# Patient Record
Sex: Female | Born: 1950 | Race: White | Hispanic: No | Marital: Married | Smoking: Never smoker
Health system: Southern US, Community
[De-identification: ages and names within clinical notes are randomized; demographics above are authoritative.]

## PROBLEM LIST (undated history)

## (undated) DIAGNOSIS — E079 Disorder of thyroid, unspecified: Secondary | ICD-10-CM

## (undated) DIAGNOSIS — Z800 Family history of malignant neoplasm of digestive organs: Secondary | ICD-10-CM

## (undated) DIAGNOSIS — M1990 Unspecified osteoarthritis, unspecified site: Secondary | ICD-10-CM

## (undated) DIAGNOSIS — E7800 Pure hypercholesterolemia, unspecified: Secondary | ICD-10-CM

## (undated) DIAGNOSIS — I2510 Atherosclerotic heart disease of native coronary artery without angina pectoris: Secondary | ICD-10-CM

## (undated) DIAGNOSIS — C50919 Malignant neoplasm of unspecified site of unspecified female breast: Secondary | ICD-10-CM

## (undated) DIAGNOSIS — Z801 Family history of malignant neoplasm of trachea, bronchus and lung: Secondary | ICD-10-CM

## (undated) DIAGNOSIS — Z803 Family history of malignant neoplasm of breast: Secondary | ICD-10-CM

## (undated) DIAGNOSIS — E669 Obesity, unspecified: Secondary | ICD-10-CM

## (undated) HISTORY — DX: Malignant neoplasm of unspecified site of unspecified female breast: C50.919

## (undated) HISTORY — DX: Atherosclerotic heart disease of native coronary artery without angina pectoris: I25.10

## (undated) HISTORY — DX: Pure hypercholesterolemia, unspecified: E78.00

## (undated) HISTORY — DX: Unspecified osteoarthritis, unspecified site: M19.90

## (undated) HISTORY — DX: Family history of malignant neoplasm of breast: Z80.3

## (undated) HISTORY — DX: Obesity, unspecified: E66.9

## (undated) HISTORY — DX: Family history of malignant neoplasm of trachea, bronchus and lung: Z80.1

## (undated) HISTORY — DX: Family history of malignant neoplasm of digestive organs: Z80.0

## (undated) HISTORY — PX: CORONARY STENT PLACEMENT: SHX1402

## (undated) HISTORY — DX: Disorder of thyroid, unspecified: E07.9

---

## 2014-01-16 HISTORY — PX: COLONOSCOPY: SHX174

## 2020-02-04 ENCOUNTER — Other Ambulatory Visit: Payer: Self-pay | Admitting: Family

## 2020-02-04 DIAGNOSIS — R921 Mammographic calcification found on diagnostic imaging of breast: Secondary | ICD-10-CM

## 2020-02-13 ENCOUNTER — Ambulatory Visit
Admission: RE | Admit: 2020-02-13 | Discharge: 2020-02-13 | Disposition: A | Discharge status: Home / Self Care | Payer: Medicare Other | Source: Ambulatory Visit | Attending: Family | Admitting: Family

## 2020-02-13 ENCOUNTER — Other Ambulatory Visit: Payer: Self-pay

## 2020-02-13 DIAGNOSIS — R921 Mammographic calcification found on diagnostic imaging of breast: Secondary | ICD-10-CM

## 2020-02-18 ENCOUNTER — Ambulatory Visit: Payer: Self-pay | Admitting: Surgery

## 2020-02-24 ENCOUNTER — Other Ambulatory Visit: Payer: Self-pay | Admitting: *Deleted

## 2020-02-24 NOTE — Progress Notes (Signed)
error 

## 2020-02-25 ENCOUNTER — Telehealth: Payer: Self-pay | Admitting: Hematology and Oncology

## 2020-02-25 NOTE — Telephone Encounter (Signed)
Received a new patient referral from Dr. Luisa Hart from [facility] for breast cancer. Pt has been cld and scheduled to see Dr. Pamelia Hoit on 6/1 at 3:45pm. Pt aware to arrive 15 minutes early.

## 2020-03-01 NOTE — Progress Notes (Signed)
[facility] CONSULT NOTE  Patient Care Team: Imagene Riches, NP as PCP - General  CHIEF COMPLAINTS/PURPOSE OF CONSULTATION:  Newly diagnosed breast cancer  HISTORY OF PRESENTING ILLNESS:  Norma Graves 69 y.o. female is here because of recent diagnosis of left breast DCIS. Mammogram showed left breast calcifications. Biopsy on 02/13/20 showed intermediate grade DCIS, ER/PR+ 100%. She presents to the clinic today for initial evaluation and discussion of treatment options.  I surely think some of the symptoms not resolve.  I reviewed her records extensively and collaborated the history with the patient.  SUMMARY OF ONCOLOGIC HISTORY: Oncology History  Ductal carcinoma in situ (DCIS) of left breast  02/13/2020 Initial Diagnosis   Screening mammogram detected less than 1 cm cluster of calcifications UIQ left breast.  Core biopsy showed intermediate grade DCIS ER 100%, PR 100%, Tis NX stage 0   03/02/2020 Cancer Staging   Staging form: Breast, AJCC 8th Edition - Clinical stage from 03/02/2020: Stage 0 (cTis (DCIS), cN0, cM0, ER+, PR+, HER2: Not Assessed) - Signed by Nicholas Lose, MD on 03/02/2020     MEDICAL HISTORY:  Coronary artery disease status post stent placement History of congestive heart failure Hypercholesterolemia Hypothyroidism  SURGICAL HISTORY: Shoulder surgery  SOCIAL HISTORY:Occasional alcohol use, denies any drug use.  Never smoker.  FAMILY HISTORY: Breast cancer in sister below age 42 and also an aunt  ALLERGIES:  has no allergies on file.  MEDICATIONS: Metoprolol 25 mg daily, aspirin 81 mg daily, Zetia 10 mg daily, Effient 10 mg daily, fluoxetine 10 mg daily, levothyroxine 50 mcg daily, multivitamin, probiotics  REVIEW OF SYSTEMS:   Constitutional: Denies fevers, chills or abnormal night sweats Eyes: Denies blurriness of vision, double vision or watery eyes Ears, nose, mouth, throat, and face: Denies mucositis or sore throat Respiratory: Denies  cough, dyspnea or wheezes Cardiovascular: Denies palpitation, chest discomfort or lower extremity swelling Gastrointestinal:  Denies nausea, heartburn or change in bowel habits Skin: Denies abnormal skin rashes Lymphatics: Denies new lymphadenopathy or easy bruising Neurological:Denies numbness, tingling or new weaknesses Behavioral/Psych: Mood is stable, no new changes  Breast: Denies any palpable lumps or discharge All other systems were reviewed with the patient and are negative.  PHYSICAL EXAMINATION: ECOG PERFORMANCE STATUS: 1 - Symptomatic but completely ambulatory  Vitals:   03/02/20 1504  BP: 112/68  Pulse: 66  Resp: 18  Temp: 98.3 F (36.8 C)  SpO2: 99%   Filed Weights   03/02/20 1504  Weight: 276 lb 9.6 oz (125.5 kg)    GENERAL:alert, no distress and comfortable SKIN: skin color, texture, turgor are normal, no rashes or significant lesions EYES: normal, conjunctiva are pink and non-injected, sclera clear OROPHARYNX:no exudate, no erythema and lips, buccal mucosa, and tongue normal  NECK: supple, thyroid normal size, non-tender, without nodularity LYMPH:  no palpable lymphadenopathy in the cervical, axillary or inguinal LUNGS: clear to auscultation and percussion with normal breathing effort HEART: regular rate & rhythm and no murmurs and no lower extremity edema ABDOMEN:abdomen soft, non-tender and normal bowel sounds Musculoskeletal:no cyanosis of digits and no clubbing  PSYCH: alert & oriented x 3 with fluent speech NEURO: no focal motor/sensory deficits  RADIOGRAPHIC STUDIES: I have personally reviewed the radiological reports and agreed with the findings in the report.  ASSESSMENT AND PLAN:  Ductal carcinoma in situ (DCIS) of left breast Screening mammogram detected less than 1 cm cluster of calcifications UIQ left breast.  Core biopsy showed intermediate grade DCIS ER 100%, PR 100%, Tis  NX stage 0  Pathology review: I discussed with the patient the  difference between DCIS and invasive breast cancer. It is considered a precancerous lesion. DCIS is classified as a 0. It is generally detected through mammograms as calcifications. We discussed the significance of grades and its impact on prognosis. We also discussed the importance of ER and PR receptors and their implications to adjuvant treatment options. Prognosis of DCIS dependence on grade, comedo necrosis. It is anticipated that if not treated, 20-30% of DCIS can develop into invasive breast cancer.  Recommendation: 1. Breast conserving surgery 2. Followed by adjuvant radiation therapy 3. Followed by antiestrogen therapy with tamoxifen 5 years  Tamoxifen counseling: We discussed the risks and benefits of tamoxifen. These include but not limited to insomnia, hot flashes, mood changes, vaginal dryness, and weight gain. Although rare, serious side effects including endometrial cancer, risk of blood clots were also discussed. We strongly believe that the benefits far outweigh the risks. Patient understands these risks and consented to starting treatment. Planned treatment duration is 5 years.  We also discussed participation in Comet clinical trial.  The trial randomized patients to surgery versus observation. AFT 25 COMET Phase 3 clinical trial for low risk DCIS grade 1/2 PR positive, age greater than 40 randomized to surgery +/- radiation, +/- endocrine therapy versus active surveillance with +/- endocrine therapy surveillance with mammograms every 6 months for 5 years;patient's have option to decline elevated arm and still be followed on study   The major issue with the DCIS treatment plan is related to her recent cardiac stent placement.  She is unable to undergo any surgical intervention for at least 6 months.  We will have to assess if on the clinical trial she is allowed to take a 36-monthhiatus if she was to be randomized to surgery arm.  We provided her with information today and we will  make a final decision on feasibility and inform her. Will consult genetics.  All questions were answered. The patient knows to call the clinic with any problems, questions or concerns.   VRulon Eisenmenger MD, MPH 03/02/2020    I, Molly Dorshimer, am acting as scribe for VNicholas Lose MD.  I have reviewed the above documentation for accuracy and completeness, and I agree with the above.

## 2020-03-02 ENCOUNTER — Inpatient Hospital Stay: Payer: Medicare Other | Attending: Hematology and Oncology | Admitting: Hematology and Oncology

## 2020-03-02 ENCOUNTER — Encounter: Payer: Self-pay | Admitting: *Deleted

## 2020-03-02 ENCOUNTER — Other Ambulatory Visit: Payer: Self-pay

## 2020-03-02 DIAGNOSIS — Z955 Presence of coronary angioplasty implant and graft: Secondary | ICD-10-CM | POA: Diagnosis not present

## 2020-03-02 DIAGNOSIS — E7800 Pure hypercholesterolemia, unspecified: Secondary | ICD-10-CM | POA: Insufficient documentation

## 2020-03-02 DIAGNOSIS — Z803 Family history of malignant neoplasm of breast: Secondary | ICD-10-CM | POA: Insufficient documentation

## 2020-03-02 DIAGNOSIS — D0512 Intraductal carcinoma in situ of left breast: Secondary | ICD-10-CM | POA: Diagnosis present

## 2020-03-02 DIAGNOSIS — I2510 Atherosclerotic heart disease of native coronary artery without angina pectoris: Secondary | ICD-10-CM | POA: Diagnosis not present

## 2020-03-02 DIAGNOSIS — E039 Hypothyroidism, unspecified: Secondary | ICD-10-CM | POA: Insufficient documentation

## 2020-03-02 DIAGNOSIS — I509 Heart failure, unspecified: Secondary | ICD-10-CM | POA: Diagnosis not present

## 2020-03-02 DIAGNOSIS — Z79899 Other long term (current) drug therapy: Secondary | ICD-10-CM | POA: Insufficient documentation

## 2020-03-02 NOTE — Research (Signed)
03/02/2020 at 4:55pm - COMET/AFT-25 study notes- Research nurse and Clinical Research Coordinator 1, Carol Ada, met with the pt for 20 minutes about the study.  The pt stated she was interested in learning more about the study.  The pt was given the consent form, the study brochure, the [facility] brochure, and the study nurse's business card.  The pt was encouraged to read the consent form and call the nurse if she has any questions or concerns about the study.  The research nurse agreed to start reviewing the pt's medical record for eligibility purposes.  The pt informed the nurse that she cannot have surgery for 6 months due to her current use of a blood thinner.  The research nurse will review the protocol to see if the pt will qualify for study entry.  The pt and research nurse agreed to talk on Friday, 6/4 or Monday, 6/7 about her participation.  The pt was thanked for her interest in the COMET study. Brion Aliment RN, BSN, CCRP  Clinical Research Nurse 03/02/2020 5:00 PM   03/10/20-12:58pm - The research nurse called the pt on 03/05/20 about the study.  The pt said that she was still interested in participation, but she is not able to have surgery until October 2021.  The research nurse informed the pt that she met all of the criteria for study entry except the "no contraindication for surgery".  The pt was also told that she could possible wait until September to enroll, but she would not be able to start any hormonal therapy until she was enrolled.  The research nurse followed up with the study coordinator, Yolande Jolly, about delaying her enrollment until closer to September.  She suggested that I contact Dr. Donnal Debar about this subject.  Dr. Lindi Adie was informed about the pt's delayed enrollment, and he agreed to hold her hormonal therapy until she was enrolled into the study.  The research nurse sent an email to Dr. Donnal Debar on 03/08/20.  He felt that it was acceptable to delay her enrollment until September as  "there is a 60 day window if she receives surgery".  Dr. Donnal Debar wanted to discuss with Dr. Glade Stanford, study PI, about this pt.  The research nurse left the pt a voicemail informing her that the research nurse was waiting to hear back from study.  The research nurse asked Dr. Donnal Debar if Dr. Glade Stanford had made a decision about if delaying her enrollment and registering her in September when the pt still had a contraindication for surgery until October.  Will await response from Dr. Glade Stanford. Brion Aliment RN, BSN, [place] Clinical Research Nurse 03/10/2020 1:09 PM   03/23/2020 at 4:32pm - Rn received email from Dr. Justice Rocher stating that Dr. Glade Stanford was "OK for this pt to be registered/randomized on 06/03/20 as there is a 60 day window if she receives the surgery arm".  Dr. Glade Stanford was "OK with the delay in the AI if the treating physician was okay" with holding her hormonal medication until after the pt is registered/randomized in September.  Dr. Lindi Adie stated that he is in agreement to enroll this pt with this plan.  The research nurse called the pt and informed her about the plan.  The pt was told that the research nurse will call her in August to set up the consent visit and to schedule a visit with Dr. Lindi Adie to obtain a H/P within 30 days of registration.  All of the pt's questions were answered.  The pt said that she was fine with this plan.  She said that she doesn't like to "rush into anything", and this plan allows her time to carefully weigh her options.  The pt was thanked for her continued interest in the study.  The pt knows to contact the research nurse if she has any further questions or if anything changes in her current health status.  The pt verbalized understanding.   Brion Aliment RN, BSN, CCRP Clinical Research Nurse 03/23/2020 4:40 PM

## 2020-03-02 NOTE — Assessment & Plan Note (Addendum)
Screening mammogram detected less than 1 cm cluster of calcifications UIQ left breast.  Core biopsy showed intermediate grade DCIS ER 100%, PR 100%, Tis NX stage 0  Pathology review: I discussed with the patient the difference between DCIS and invasive breast cancer. It is considered a precancerous lesion. DCIS is classified as a 0. It is generally detected through mammograms as calcifications. We discussed the significance of grades and its impact on prognosis. We also discussed the importance of ER and PR receptors and their implications to adjuvant treatment options. Prognosis of DCIS dependence on grade, comedo necrosis. It is anticipated that if not treated, 20-30% of DCIS can develop into invasive breast cancer.  Recommendation: 1. Breast conserving surgery 2. Followed by adjuvant radiation therapy 3. Followed by antiestrogen therapy with tamoxifen 5 years  Tamoxifen counseling: We discussed the risks and benefits of tamoxifen. These include but not limited to insomnia, hot flashes, mood changes, vaginal dryness, and weight gain. Although rare, serious side effects including endometrial cancer, risk of blood clots were also discussed. We strongly believe that the benefits far outweigh the risks. Patient understands these risks and consented to starting treatment. Planned treatment duration is 5 years.  We also discussed participation in Comet clinical trial.  The trial randomized patients to surgery versus observation. AFT 25 COMET Phase 3 clinical trial for low risk DCIS grade 1/2 PR positive, age greater than 40 randomized to surgery +/- radiation, +/- endocrine therapy versus active surveillance with +/- endocrine therapy surveillance with mammograms every 6 months for 5 years;patient's have option to decline elevated arm and still be followed on study

## 2020-03-03 ENCOUNTER — Encounter: Payer: Self-pay | Admitting: *Deleted

## 2020-03-04 ENCOUNTER — Encounter: Payer: Self-pay | Admitting: Licensed Clinical Social Worker

## 2020-03-04 NOTE — Progress Notes (Signed)
[facility] Clinical Social Work  Clinical Social Work reached out to new patient for assessment of psychosocial needs.  Clinical Social Worker contacted patient by phone  to offer support and assess for needs.    Patient reports that she is doing well so far, just waiting to hear back regarding the COMET clinical trial. She feels she received good information during her visit with Dr. Pamelia Hoit and that helped reassure her. She receives good support from her husband and has no concerns regarding transportation or resources at this time.  CSW gave direct contact information should patient need support in the future.    Norma Graves E, LCSW  Clinical Social Worker [facility]

## 2020-03-05 ENCOUNTER — Telehealth: Payer: Self-pay | Admitting: Genetic Counselor

## 2020-03-05 NOTE — Telephone Encounter (Signed)
Scheduled per los, patient has been called and notified. 

## 2020-03-12 ENCOUNTER — Encounter: Payer: Self-pay | Admitting: *Deleted

## 2020-03-18 ENCOUNTER — Encounter: Payer: Self-pay | Admitting: Genetic Counselor

## 2020-03-18 ENCOUNTER — Other Ambulatory Visit: Payer: Self-pay | Admitting: Genetic Counselor

## 2020-03-18 ENCOUNTER — Inpatient Hospital Stay (HOSPITAL_BASED_OUTPATIENT_CLINIC_OR_DEPARTMENT_OTHER): Payer: Medicare Other | Admitting: Genetic Counselor

## 2020-03-18 ENCOUNTER — Other Ambulatory Visit: Payer: Self-pay

## 2020-03-18 ENCOUNTER — Inpatient Hospital Stay: Payer: Medicare Other

## 2020-03-18 DIAGNOSIS — Z801 Family history of malignant neoplasm of trachea, bronchus and lung: Secondary | ICD-10-CM | POA: Insufficient documentation

## 2020-03-18 DIAGNOSIS — Z803 Family history of malignant neoplasm of breast: Secondary | ICD-10-CM | POA: Insufficient documentation

## 2020-03-18 DIAGNOSIS — D0512 Intraductal carcinoma in situ of left breast: Secondary | ICD-10-CM

## 2020-03-18 DIAGNOSIS — Z1379 Encounter for other screening for genetic and chromosomal anomalies: Secondary | ICD-10-CM

## 2020-03-18 DIAGNOSIS — Z800 Family history of malignant neoplasm of digestive organs: Secondary | ICD-10-CM | POA: Insufficient documentation

## 2020-03-18 NOTE — Progress Notes (Signed)
REFERRING PROVIDER: Nicholas Lose, MD [address] [place],  [place] [address]  PRIMARY PROVIDER:  Imagene Riches, NP  PRIMARY REASON FOR VISIT:  1. Ductal carcinoma in situ (DCIS) of left breast   2. Family history of breast cancer   3. Family history of rectal cancer   4. Family history of lung cancer       HISTORY OF PRESENT ILLNESS:   [name], a 69 y.o. female, was seen for a [place] cancer genetics consultation at the request of Dr. Lindi Adie due to a personal and family history of cancer.  Norma Graves presents to clinic today with her daughter to discuss the possibility of a hereditary predisposition to cancer, genetic testing, and to further clarify her future cancer risks, as well as potential cancer risks for family members.   In May 2021, at the age of 44, Norma Graves was diagnosed with ductal carcinoma in situ, ER+/PR+, of the left breast. The treatment plan includes consideration of the COMET clinical trial, surgery, adjuvant radiation therapy, and antiestrogen therapy.    CANCER HISTORY:  Oncology History  Ductal carcinoma in situ (DCIS) of left breast  02/13/2020 Initial Diagnosis   Screening mammogram detected less than 1 cm cluster of calcifications UIQ left breast.  Core biopsy showed intermediate grade DCIS ER 100%, PR 100%, Tis NX stage 0   03/02/2020 Cancer Staging   Staging form: Breast, AJCC 8th Edition - Clinical stage from 03/02/2020: Stage 0 (cTis (DCIS), cN0, cM0, ER+, PR+, HER2: Not Assessed) - Signed by Nicholas Lose, MD on 03/02/2020     RISK FACTORS:  Menarche was at age 47-14.  First live birth at age 53.  OCP use for approximately 2 years.  Ovaries intact: yes.  Hysterectomy: no.  Menopausal status: postmenopausal.  HRT use: 0 years. Colonoscopy: yes; four years ago; around 2 polyps, per patient. Mammogram within the last year: yes. Number of breast biopsies: 2. Any excessive radiation exposure in the past: no   Past  Medical History:  Diagnosis Date  . Family history of breast cancer   . Family history of lung cancer   . Family history of rectal cancer      Social History   Socioeconomic History  . Marital status: Married    Spouse name: Not on file  . Number of children: Not on file  . Years of education: Not on file  . Highest education level: Not on file  Occupational History  . Not on file  Tobacco Use  . Smoking status: Not on file  Substance and Sexual Activity  . Alcohol use: Not on file  . Drug use: Not on file  . Sexual activity: Not on file  Other Topics Concern  . Not on file  Social History Narrative  . Not on file   Social Determinants of Health   Financial Resource Strain:   . Difficulty of Paying Living Expenses:   Food Insecurity:   . Worried About Charity fundraiser in the Last Year:   . Arboriculturist in the Last Year:   Transportation Needs:   . Film/video editor (Medical):   Marland Kitchen Lack of Transportation (Non-Medical):   Physical Activity:   . Days of Exercise per Week:   . Minutes of Exercise per Session:   Stress:   . Feeling of Stress :   Social Connections:   . Frequency of Communication with Friends and Family:   . Frequency of Social Gatherings with  Friends and Family:   . Attends Religious Services:   . Active Member of Clubs or Organizations:   . Attends Archivist Meetings:   Marland Kitchen Marital Status:      FAMILY HISTORY:  We obtained a detailed, 4-generation family history.  Significant diagnoses are listed below: Family History  Problem Relation Age of Onset  . Rectal cancer Mother        dx. in her late 88s  . Lung cancer Father 51       smoker  . Breast cancer Sister        dx. in her 9s  . Breast cancer Maternal Aunt 3   Norma Graves has two daughters (ages 28 and 55). She has two sisters and two brothers. One sister died in her late 33s and had a history of breast cancer diagnosed in her 22s.   Norma Graves mother died  in her late 16s and had a history of rectal cancer diagnosed in her late 59s. Norma Graves had three maternal aunts and three maternal uncles. One aunt had a history of breast cancer diagnosed around the age of 48. There is no cancer among maternal cousins. Her maternal grandmother died in her 57s and her maternal grandfather died in his 79s. There are no other known diagnoses of cancer on the maternal side of the family.  Norma Graves's father died at the age of 29 from lung cancer and had a history of smoking. She had three paternal aunts and four paternal uncles. She does not know of any cancer among these relatives, although she has limited information about them. Her paternal grandmother died in her 22s with Alzheimer's, and her paternal grandfather died in his 48s or 33s. She is unsure if her grandfather may have had cancer. There are no other known cancer diagnoses on the paternal side of the family.  Norma Graves is unaware of previous family history of genetic testing for hereditary cancer risks. Patient's maternal ancestors are of unknown descent, and paternal ancestors are of English descent. There is no reported Ashkenazi Jewish ancestry. There is no known consanguinity.  GENETIC COUNSELING ASSESSMENT: Norma Graves is a 69 y.o. female with a personal and family history of breast cancer, which is somewhat suggestive of a hereditary cancer syndrome and predisposition to cancer. We, therefore, discussed and recommended the following at today's visit.   DISCUSSION: We discussed that 5-10% of breast cancer is hereditary, with most cases associated with the BRCA1 and BRCA2 genes.  There are other genes that can be associated with hereditary breast cancer syndromes.  These include ATM, CHEK2, PALB2, etc.  We discussed that testing is beneficial for several reasons, including knowing about other cancer risks, identifying potential screening and risk-reduction options that may be appropriate, and to  understand if other family members could be at risk for cancer and allow them to undergo genetic testing.   We reviewed the characteristics, features and inheritance patterns of hereditary cancer syndromes. We also discussed genetic testing, including the appropriate family members to test, the process of testing, insurance coverage and turn-around-time for results. We discussed the implications of a negative, positive and/or variant of uncertain significant result. We recommended Ms. Hazell pursue genetic testing for the [facility] Common Hereditary Cancers panel.   The Common Hereditary Cancers Panel offered by [facility] includes sequencing and/or deletion duplication testing of the following 48 genes: APC, ATM, AXIN2, BARD1, BMPR1A, BRCA1, BRCA2, BRIP1, CDH1, CDK4, CDKN2A (p14ARF), CDKN2A (p16INK4a), CHEK2, CTNNA1, DICER1, EPCAM (Deletion/duplication  testing only), GREM1 (promoter region deletion/duplication testing only), KIT, MEN1, MLH1, MSH2, MSH3, MSH6, MUTYH, NBN, NF1, NHTL1, PALB2, PDGFRA, PMS2, POLD1, POLE, PTEN, RAD50, RAD51C, RAD51D, RNF43, SDHB, SDHC, SDHD, SMAD4, SMARCA4. STK11, TP53, TSC1, TSC2, and VHL.  The following genes were evaluated for sequence changes only: SDHA and HOXB13 c.251G>A variant only.   Based on Ms. Heidelberger's personal and family history of cancer, she meets medical criteria for genetic testing. Despite that she meets criteria, she may still have an out of pocket cost. We discussed that if her out of pocket cost for testing is over $100, the laboratory will reach out to let her know.  If the out of pocket cost of testing is less than $100 she will be billed by the genetic testing laboratory.   PLAN: After considering the risks, benefits, and limitations, Ms. Iwasaki provided informed consent to pursue genetic testing and the blood sample was sent to [facility] for analysis of the Common Hereditary Cancers Panel. Results should be available within approximately  two-three weeks' time, at which point they will be disclosed by telephone to Ms. Venturini, as will any additional recommendations warranted by these results. Ms. Shidler will receive a summary of her genetic counseling visit and a copy of her results once available. This information will also be available in Epic.   Ms. Mandarino questions were answered to her satisfaction today. Our contact information was provided should additional questions or concerns arise. Thank you for the referral and allowing [place] to share in the care of your patient.   Clint Guy, [place], [facility] Licensed, Certified Dispensing [email or web address] Phone: [phone]  The patient was seen for a total of 50 minutes in face-to-face genetic counseling.  This patient was discussed with Drs. Magrinat, Lindi Adie and/or Burr Medico who agrees with the above.    _______________________________________________________________________ For Office Staff:  Number of people involved in session: 1 Was an Intern/ student involved with case: Yes - two genetic counseling interns observed only

## 2020-03-25 ENCOUNTER — Encounter: Payer: Self-pay | Admitting: *Deleted

## 2020-03-25 LAB — GENETIC SCREENING ORDER

## 2020-03-26 ENCOUNTER — Telehealth: Payer: Self-pay | Admitting: Genetic Counselor

## 2020-03-26 ENCOUNTER — Encounter: Payer: Self-pay | Admitting: Genetic Counselor

## 2020-03-26 ENCOUNTER — Ambulatory Visit: Payer: Self-pay | Admitting: Genetic Counselor

## 2020-03-26 DIAGNOSIS — Z1379 Encounter for other screening for genetic and chromosomal anomalies: Secondary | ICD-10-CM | POA: Insufficient documentation

## 2020-03-26 NOTE — Progress Notes (Signed)
HPI:  Ms. Hofstra was previously seen in the [place] clinic due to a personal and family history of cancer and concerns regarding a hereditary predisposition to cancer. Please refer to our prior cancer genetics clinic note for more information regarding our discussion, assessment and recommendations, at the time. Ms. Wahlberg recent genetic test results were disclosed to her, as were recommendations warranted by these results. These results and recommendations are discussed in more detail below.  CANCER HISTORY:  Oncology History  Ductal carcinoma in situ (DCIS) of left breast  02/13/2020 Initial Diagnosis   Screening mammogram detected less than 1 cm cluster of calcifications UIQ left breast.  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 The report date is 03/25/2020.  The Common Hereditary Cancers Panel offered by [facility] includes sequencing and/or deletion duplication testing of the following 48 genes: APC, ATM, AXIN2, BARD1, BMPR1A, BRCA1, BRCA2, BRIP1, CDH1, CDK4, CDKN2A (p14ARF), CDKN2A (p16INK4a), CHEK2, CTNNA1, DICER1, EPCAM (Deletion/duplication testing only), GREM1 (promoter region deletion/duplication testing only), KIT, MEN1, MLH1, MSH2, MSH3, MSH6, MUTYH, NBN, NF1, NHTL1, PALB2, PDGFRA, PMS2, POLD1, POLE, PTEN, RAD50, RAD51C, RAD51D, RNF43, SDHB, SDHC, SDHD, SMAD4, SMARCA4. STK11, TP53, TSC1, TSC2, and VHL.  The following genes were evaluated for sequence changes only: SDHA and HOXB13 c.251G>A variant only.     FAMILY HISTORY:  We obtained a detailed, 4-generation family history.  Significant  diagnoses are listed below: Family History  Problem Relation Age of Onset  . Rectal cancer Mother        dx. in her late 35s  . Lung cancer Father 6       smoker  . Breast cancer Sister        dx. in her 39s  . Breast cancer Maternal Aunt 78   Ms. Longwell has two daughters (ages 36 and 59). She has two sisters and two brothers. One sister died in her late 33s and had a history of breast cancer diagnosed in her 33s.   Ms. Demeter mother died in her late 9s and had a history of rectal cancer diagnosed in her late 13s. Ms. Creer had three maternal aunts and three maternal uncles. One aunt had a history of breast cancer diagnosed around the age of 8. There is no cancer among maternal cousins. Her maternal grandmother died in her 61s and her maternal grandfather died in his 61s. There are no other known diagnoses of cancer on the maternal side of the family.  Ms. Forni's father died at the age of 42 from lung cancer and had a history of smoking. She had three paternal aunts and four paternal uncles. She does not know of any cancer among these relatives, although she has limited information about them. Her paternal grandmother died in her 96s with Alzheimer's, and her paternal grandfather died in his 55s or 42s. She is unsure if her grandfather may have had cancer. There are no other known cancer diagnoses on the paternal side of the family.  Ms. Lordi is unaware of previous family history of genetic testing for hereditary cancer risks. Patient's maternal ancestors are of unknown descent, and paternal  ancestors are of English descent. There is no reported Ashkenazi Jewish ancestry. There is no known consanguinity.  GENETIC TEST RESULTS: Genetic testing reported out on 03/25/2020 through the [facility] Common Hereditary Cancers panel. No pathogenic variants were detected.   The Common Hereditary Cancers Panel offered by [facility] includes sequencing and/or deletion duplication testing of  the following 48 genes: APC, ATM, AXIN2, BARD1, BMPR1A, BRCA1, BRCA2, BRIP1, CDH1, CDK4, CDKN2A (p14ARF), CDKN2A (p16INK4a), CHEK2, CTNNA1, DICER1, EPCAM (Deletion/duplication testing only), GREM1 (promoter region deletion/duplication testing only), KIT, MEN1, MLH1, MSH2, MSH3, MSH6, MUTYH, NBN, NF1, NHTL1, PALB2, PDGFRA, PMS2, POLD1, POLE, PTEN, RAD50, RAD51C, RAD51D, RNF43, SDHB, SDHC, SDHD, SMAD4, SMARCA4. STK11, TP53, TSC1, TSC2, and VHL.  The following genes were evaluated for sequence changes only: SDHA and HOXB13 c.251G>A variant only. The test report will be scanned into EPIC and located under the Molecular Pathology section of the Results Review tab.  A portion of the result report is included below for reference.     We discussed with Ms. Hebel that because current genetic testing is not perfect, it is possible there may be a gene mutation in one of these genes that current testing cannot detect, but that chance is small.  We also discussed, that there could be another gene that has not yet been discovered, or that we have not yet tested, that is responsible for the cancer diagnoses in the family. It is also possible there is a hereditary cause for the cancer in the family that Ms. Huot did not inherit and therefore was not identified in her testing.  Therefore, it is important to remain in touch with cancer genetics in the future so that we can continue to offer Ms. Lenig the most up to date genetic testing.   CANCER SCREENING RECOMMENDATIONS: Ms. Flippen test result is considered negative (normal).  This means that we have not identified a hereditary cause for her personal and family history of cancer at this time. While reassuring, this does not definitively rule out a hereditary predisposition to cancer. It is still possible that there could be genetic mutations that are undetectable by current technology. There could be genetic mutations in genes that have not been tested or  identified to increase cancer risk.  Therefore, it is recommended she continue to follow the cancer management and screening guidelines provided by her oncology and primary healthcare providers.   An individual's cancer risk and medical management are not determined by genetic test results alone. Overall cancer risk assessment incorporates additional factors, including personal medical history, family history, and any available genetic information that may result in a personalized plan for cancer prevention and surveillance.  RECOMMENDATIONS FOR FAMILY MEMBERS:  Individuals in this family might be at some increased risk of developing cancer, over the general population risk, simply due to the family history of cancer.  We recommended women in this family have a yearly mammogram beginning at age 9, or 38 years younger than the earliest onset of cancer, an annual clinical breast exam, and perform monthly breast self-exams. Women in this family should also have a gynecological exam as recommended by their primary provider. All family members should be referred for colonoscopy starting at age 32.  It is also possible there is a hereditary cause for the cancer in Ms. Springborn's family that she did not inherit and therefore was not identified in her.  Based on Ms. Mcandrew's family history, we recommended her siblings have genetic counseling and testing. Ms. Bleau will let us know  if we can be of any assistance in coordinating genetic counseling and/or testing for these family members.   FOLLOW-UP: Lastly, we discussed with Ms. Girardot that cancer genetics is a rapidly advancing field and it is possible that new genetic tests will be appropriate for her and/or her family members in the future. We encouraged her to remain in contact with cancer genetics on an annual basis so we can update her personal and family histories and let her know of advances in cancer genetics that may benefit this family.   Our  contact number was provided. Ms. Salas questions were answered to her satisfaction, and she knows she is welcome to call us at anytime with additional questions or concerns.   Clint Guy, MS, [facility] Genetic Counselor [email or web address] Phone: [phone]

## 2020-03-26 NOTE — Telephone Encounter (Signed)
Revealed negative genetic testing. Discussed that we do not know why she has breast cancer or why there is cancer in the family. There could be a genetic mutation in the family that Norma Graves did not inherit. There could also be a mutation in a different gene that we are not testing, or our current technology may not be able detect certain mutations. It will therefore be important for her to stay in contact with genetics to keep up with whether additional testing may be appropriate in the future.

## 2020-05-07 ENCOUNTER — Telehealth: Payer: Self-pay | Admitting: *Deleted

## 2020-05-07 NOTE — Telephone Encounter (Signed)
05/07/20 at 10:32am- COMET study- The research nurse called the pt to see if she was still interested in participating in the COMET study.  The pt said that she still interested in the study.  The pt agreed to come to the [facility] in late August for her updated H/P with Dr. Pamelia Hoit.  The pt also agreed to meet with the research nurse to sign the consent form and complete her baseline questionnaire for the study.  The pt was encouraged to re-read her consent form, and ask Dr. Pamelia Hoit or the study nurse any questions/concerns she has about the study.  The pt must be enrolled into the study by 06/03/20.  The pt was thanked for her ongoing interest in the COMET study.  Janan Ridge RN, BSN, CCRP Clinical Research Nurse 05/07/2020 10:37 AM

## 2020-05-19 ENCOUNTER — Encounter: Payer: Self-pay | Admitting: *Deleted

## 2020-05-30 NOTE — Progress Notes (Signed)
Patient Care Team: Imagene Riches, NP as PCP - Philomena Doheny, Paulette Blanch, RN as Oncology Nurse Navigator Rockwell Germany, RN as Oncology Nurse Navigator Nicholas Lose, MD as Medical Oncologist (Hematology and Oncology)  DIAGNOSIS:    ICD-10-CM   1. Ductal carcinoma in situ (DCIS) of left breast  D05.12     SUMMARY OF ONCOLOGIC HISTORY: Oncology History  Ductal carcinoma in situ (DCIS) of left breast  02/13/2020 Initial Diagnosis   Screening mammogram detected less than 1 cm cluster of calcifications UIQ left breast.  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 The report date is 03/25/2020.  The Common Hereditary Cancers Panel offered by [facility] includes sequencing and/or deletion duplication testing of the following 48 genes: APC, ATM, AXIN2, BARD1, BMPR1A, BRCA1, BRCA2, BRIP1, CDH1, CDK4, CDKN2A (p14ARF), CDKN2A (p16INK4a), CHEK2, CTNNA1, DICER1, EPCAM (Deletion/duplication testing only), GREM1 (promoter region deletion/duplication testing only), KIT, MEN1, MLH1, MSH2, MSH3, MSH6, MUTYH, NBN, NF1, NHTL1, PALB2, PDGFRA, PMS2, POLD1, POLE, PTEN, RAD50, RAD51C, RAD51D, RNF43, SDHB, SDHC, SDHD, SMAD4, SMARCA4. STK11, TP53, TSC1, TSC2, and VHL.  The following genes were evaluated for sequence changes only: SDHA and HOXB13 c.251G>A variant only.     CHIEF COMPLIANT: Follow-up of left breast DCIS to discuss COMET clinical trial  INTERVAL HISTORY: Norma Graves is a 69 y.o. with above-mentioned history of left breast DCIS. She presents to the clinic today to discuss participation in the COMET clinical trial.  She has  occasional left breast discomfort and her left breast feels bigger than the right.  She has had problems with her knees which cause pain.  She is getting physical therapy for that.  ALLERGIES:  has no allergies on file.  MEDICATIONS:  No current outpatient medications on file.   No current facility-administered medications for this visit.    PHYSICAL EXAMINATION: HEENT: No palpable lumps or nodules Lungs: Clear Heart: S1 is normal there is an ejection systolic murmur Abdomen: No palpable liver or spleen enlargement Extremities no edema, the leg itself appears to be enlarged Neurological exam: Grossly intact  ECOG PERFORMANCE STATUS: 1 - Symptomatic but completely ambulatory  Vitals:   05/31/20 0841  BP: 124/74  Pulse: 63  Resp: 17  Temp: (!) 97.1 F (36.2 C)  SpO2: 100%   Filed Weights   05/31/20 0841  Weight: 272 lb 12.8 oz (123.7 kg)     ASSESSMENT & PLAN:  Ductal carcinoma in situ (DCIS) of left breast Screening mammogram detected less than 1 cm cluster of calcifications UIQ left breast.  Core biopsy showed intermediate grade DCIS ER 100%, PR 100%, Tis NX stage 0 Genetic testing: Negative Current treatment: COMET study participation Patient is eligible for the trial. All of her questions have been answered.  Cardiac stent placement: Patient has 2 months to undergo surgery if she is randomized to the surgery arm.  Return to clinic based on her randomization    No orders of the defined types were placed in this encounter.  The patient has a good understanding of the overall plan. she agrees with it. she will call with any problems that may develop  before the next visit here.  Total time spent: 30 mins including face to face time and time spent for planning, charting and coordination of care  Nicholas Lose, MD 05/31/2020  I, Cloyde Reams Dorshimer, am acting as scribe for Dr. Nicholas Lose.  I have reviewed the above documentation for accuracy and completeness, and I  agree with the above.

## 2020-05-31 ENCOUNTER — Encounter: Payer: Self-pay | Admitting: *Deleted

## 2020-05-31 ENCOUNTER — Inpatient Hospital Stay: Payer: Medicare Other | Attending: Hematology and Oncology | Admitting: Hematology and Oncology

## 2020-05-31 ENCOUNTER — Other Ambulatory Visit: Payer: Self-pay

## 2020-05-31 DIAGNOSIS — Z79811 Long term (current) use of aromatase inhibitors: Secondary | ICD-10-CM | POA: Diagnosis not present

## 2020-05-31 DIAGNOSIS — D0512 Intraductal carcinoma in situ of left breast: Secondary | ICD-10-CM | POA: Diagnosis present

## 2020-05-31 DIAGNOSIS — R011 Cardiac murmur, unspecified: Secondary | ICD-10-CM | POA: Insufficient documentation

## 2020-05-31 DIAGNOSIS — Z170 Estrogen receptor positive status [ER+]: Secondary | ICD-10-CM | POA: Diagnosis not present

## 2020-05-31 DIAGNOSIS — Z955 Presence of coronary angioplasty implant and graft: Secondary | ICD-10-CM | POA: Insufficient documentation

## 2020-05-31 DIAGNOSIS — Z79899 Other long term (current) drug therapy: Secondary | ICD-10-CM | POA: Insufficient documentation

## 2020-05-31 NOTE — Assessment & Plan Note (Signed)
Screening mammogram detected less than 1 cm cluster of calcifications UIQ left breast.  Core biopsy showed intermediate grade DCIS ER 100%, PR 100%, Tis NX stage 0 Genetic testing: Negative Current treatment: COMET study participation  Cardiac stent placement: Patient is now elliptical to undergo surgery if she is randomized to the surgery arm.  Return to clinic based on her randomization

## 2020-05-31 NOTE — Research (Signed)
05/31/2020 at 10:28am - AFT-25/COMET study- consent visit study notes- The Norma Graves and her husband, Eulas Post, met with Dr. Lindi Adie this morning to discuss her treatment options regarding her DCIS.  Dr. Lindi Adie spent 30 minutes with the Norma Graves and her husband going over her diagnosis, the treatment arms on the Comet study, and endocrine therapy.  After Dr. Geralyn Flash visit, the Norma Graves, her husband and the research nurse met for additional 45 minutes going over the consent form.The research nurse went page by page over the consent form with the Norma Graves and her husband.  The research nurse and Dr. Lindi Adie answered all of their questions.The Norma Graves said that she had read over the consent form at home, and she wanted to participate in the COMET study. The Norma Graves signedthe consent form at 10:00am. The Norma Graves agreed to the optional component of the study.  The Norma Graves was given a copy of her signed consent form for her records. The Norma Graves was also given the participant study brochure, the study card, and the patient letter for additional resources about the study.  The Norma Graves also signed the release of information form so the nurse could obtain a copy of her current medication list from [facility]. The Norma Graves then was given the baseline questionnaires to complete in the clinic.  The Norma Graves completed the questionnaires with no help in 25 minutes. The research nurse reviewed the questionnaires for accuracy and completeness. The Norma Graves stated that she is post-menopausal. Therefore, there is no need for a pregnancy test. The Norma Graves reports 2 full term pregnancies.  She reported that she might have been pregnant a 3rd time, but she had a bad fall in her yard.  Her doctor told her that she might have been pregnant.  She said that a 3rd pregnancy was not confirmed.  The Norma Graves said that she has not had a total hysterectomy.  She said that she has both of her ovaries.  She said that she has not used any  hormonal replacement therapy.  She stated that she never used the "birth control  pill" either.  The research nurse reviewed the study solicited AE's with the Norma Graves.  The Norma Graves reported the following baseline AE's:   grade 1 osteopenia, grade 2 arthralgia (knees, shoulder, and thumb), grade 1 hot flashes (mainly night sweats), grade 2 high cholesterol, and grade 1 fatigue.    She denies the following AE's:  myagia, high BP, and fractures.  She denies any current orthopedic problems other than her knees.   The Norma Graves is currently receiving Norma Graves for her knee pain.  Shedenies the use of any investigational cancer agent.  The Norma Graves said that she "might have had DCIS in her right breast in 1995".  She said that her right breast was not biopsied.  The Norma Graves denies any breast cancer treatment including any hormonal therapy in the present or the past.  The Norma Graves was thanked for her willingness to participate in this study. The research nurse will have a 2nd nurse review the Norma Graves's chart for eligibility criteria prior to proceeding with registration/randomization. The research nurse requested the Norma Graves's tumor block for submission to [employer] Bio-repository.  The research nurse also asked Dr. Dimas Aguas to provide a "clock face" position for her DCIS location at baseline.   Brion Aliment RN, BSN, [place]  Clinical Research Nurse 10/15/2019 4:58 PM   06/01/2020 at 2:55pm- Research nurse called [facility] about adding "clock face" position to the Norma Graves's mammogram report.  Another message was sent to Dr.  Ballatyne requesting this additional information for the COMET study.  Brion Aliment RN, BSN, [place] Clinical Research Nurse 06/01/2020 2:57 PM   06/02/2020 at 11:59am - The research nurse, Hendricks Limes, completed the Norma Graves's 2nd review of the chart.  The Norma Graves was deemed eligible for study entry.  Dr. Lindi Adie also confirmed that the Norma Graves met all inclusion and exclusion criteria and was eligible for study registration/randomization.  The Norma Graves was successfully registered/randomized this morning, and the Norma Graves was randomized to the Active  Monitoring arm.  Dr. Lindi Adie was notified of the Norma Graves's assigned treatment arm.  The Norma Graves will be notified of her randomization later today.   Brion Aliment RN, BSN, CCRP Clinical Research Nurse 06/02/2020 12:04 PM   06/02/2020 at 2:45pm- The Norma Graves was notified that she received the Active Monitoring arm.  The Norma Graves said that she is comfortable with her assigned arm.  The Norma Graves agreed to come to the [facility] on 06/14/20 to have her research labs drawn and to see Dr. Lindi Adie for her endocrine therapy discussion.  The Norma Graves was informed that her next mammogram will be done 6 months from her registration date today.  The Norma Graves verbalized understanding.  The Norma Graves was thanked for her continued support in the COMET study. The Norma Graves had no questions for the research nurse.   Brion Aliment RN, BSN, CCRP Clinical Research Nurse 06/02/2020 2:54 PM

## 2020-06-01 ENCOUNTER — Telehealth: Payer: Self-pay | Admitting: Hematology and Oncology

## 2020-06-01 NOTE — Telephone Encounter (Signed)
No 8/30 los, no changes made to pt schedule   

## 2020-06-10 ENCOUNTER — Encounter: Payer: Self-pay | Admitting: *Deleted

## 2020-06-13 NOTE — Progress Notes (Signed)
Patient Care Team: Imagene Riches, NP as PCP - Philomena Doheny, Paulette Blanch, RN as Oncology Nurse Navigator Rockwell Germany, RN as Oncology Nurse Navigator Nicholas Lose, MD as Medical Oncologist (Hematology and Oncology)  DIAGNOSIS:    ICD-10-CM   1. Ductal carcinoma in situ (DCIS) of left breast  D05.12     SUMMARY OF ONCOLOGIC HISTORY: Oncology History  Ductal carcinoma in situ (DCIS) of left breast  02/13/2020 Initial Diagnosis   Screening mammogram detected less than 1 cm cluster of calcifications UIQ left breast.  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 The report date is 03/25/2020.  The Common Hereditary Cancers Panel offered by [facility] includes sequencing and/or deletion duplication testing of the following 48 genes: APC, ATM, AXIN2, BARD1, BMPR1A, BRCA1, BRCA2, BRIP1, CDH1, CDK4, CDKN2A (p14ARF), CDKN2A (p16INK4a), CHEK2, CTNNA1, DICER1, EPCAM (Deletion/duplication testing only), GREM1 (promoter region deletion/duplication testing only), KIT, MEN1, MLH1, MSH2, MSH3, MSH6, MUTYH, NBN, NF1, NHTL1, PALB2, PDGFRA, PMS2, POLD1, POLE, PTEN, RAD50, RAD51C, RAD51D, RNF43, SDHB, SDHC, SDHD, SMAD4, SMARCA4. STK11, TP53, TSC1, TSC2, and VHL.  The following genes were evaluated for sequence changes only: SDHA and HOXB13 c.251G>A variant only.     CHIEF COMPLIANT: Follow-up of left breast DCIS on COMET clinical trial  INTERVAL HISTORY: Norma Graves is a 69 y.o. with above-mentioned history of left breast DCIS currently in the COMET clinical trial, randomized to the active surveillance arm. She presents to the clinic  todayto discuss antiestrogen therapy.    ALLERGIES:  has no allergies on file.  MEDICATIONS:  Current Outpatient Medications  Medication Sig Dispense Refill  . acetaminophen (TYLENOL) 325 MG tablet Take 325 mg by mouth every 6 (six) hours as needed.    Marland Kitchen aspirin 81 MG EC tablet Take 81 mg by mouth every evening.    . calcium carbonate (OS-CAL) 1250 (500 Ca) MG chewable tablet Chew 200 mg by mouth 3 (three) times daily as needed for heartburn. 200 mg calcium (500 mg) chewable tablet    . Cyanocobalamin (VITAMIN B-12 PO) Take 1,000 mcg by mouth daily.    Marland Kitchen ezetimibe (ZETIA) 10 MG tablet Take 10 mg by mouth daily.    Marland Kitchen FLUoxetine (PROZAC) 10 MG capsule Take 10 mg by mouth every other day.    . levothyroxine (SYNTHROID) 50 MCG tablet Take 50 mcg by mouth daily. Daily at 0600    . metoprolol succinate (TOPROL-XL) 25 MG 24 hr tablet Take 25 mg by mouth daily.    . Multiple Vitamin (MULTIVITAMIN) capsule Take 1 capsule by mouth daily.    . nitroGLYCERIN (NITROSTAT) 0.4 MG SL tablet Place 0.4 mg under the tongue every 5 (five) minutes as needed for chest pain.     No current facility-administered medications for this visit.    PHYSICAL EXAMINATION: ECOG PERFORMANCE STATUS: 1 - Symptomatic but completely ambulatory  There were no vitals filed for this visit. There were no vitals filed for this visit.  LABORATORY DATA:  I have reviewed the data as listed No flowsheet data found.  No results found for: WBC, HGB, HCT, MCV, PLT, NEUTROABS  ASSESSMENT &  PLAN:  Ductal carcinoma in situ (DCIS) of left breast Screening mammogram detected less than 1 cm cluster of calcifications UIQ left breast. Core biopsy showed intermediate grade DCIS ER 100%, PR 100%, Tis NX stage 0 Genetic testing: Negative  Current treatment: COMET study participation, randomized to active surveillance  Cardiac stent placement: Patient has 2 months to undergo surgery if she is randomized to the surgery arm. Patient  has been randomized to the active surveillance arm.Return to clinic based on her randomization  Treatment plan: Risk reduction with anastrozole 1 mg daily is recommended.  Tamoxifen would increase her risk of thromboembolic disease and therefore we chose anastrozole instead.  Anastrozole counseling:We discussed the risks and benefits of anti-estrogen therapy with aromatase inhibitors. These include but not limited to insomnia, hot flashes, mood changes, vaginal dryness, bone density loss, and weight gain. We strongly believe that the benefits far outweigh the risks. Patient understands these risks and consented to starting treatment. Planned treatment duration is 5 years.  Return to clinic in 1 month for a telephone visit She will need a unilateral mammogram 12/01/2019 on her left side.    No orders of the defined types were placed in this encounter.  The patient has a good understanding of the overall plan. she agrees with it. she will call with any problems that may develop before the next visit here.  Total time spent: 30 mins including face to face time and time spent for planning, charting and coordination of care  Nicholas Lose, MD 06/14/2020  I, Cloyde Reams Dorshimer, am acting as scribe for Dr. Nicholas Lose.  I have reviewed the above documentation for accuracy and completeness, and I agree with the above.

## 2020-06-14 ENCOUNTER — Encounter: Payer: Self-pay | Admitting: *Deleted

## 2020-06-14 ENCOUNTER — Inpatient Hospital Stay: Payer: Medicare Other

## 2020-06-14 ENCOUNTER — Other Ambulatory Visit: Payer: Self-pay

## 2020-06-14 ENCOUNTER — Inpatient Hospital Stay: Payer: Medicare Other | Attending: Hematology and Oncology | Admitting: Hematology and Oncology

## 2020-06-14 DIAGNOSIS — D0512 Intraductal carcinoma in situ of left breast: Secondary | ICD-10-CM

## 2020-06-14 DIAGNOSIS — Z006 Encounter for examination for normal comparison and control in clinical research program: Secondary | ICD-10-CM

## 2020-06-14 DIAGNOSIS — Z170 Estrogen receptor positive status [ER+]: Secondary | ICD-10-CM | POA: Insufficient documentation

## 2020-06-14 DIAGNOSIS — Z79811 Long term (current) use of aromatase inhibitors: Secondary | ICD-10-CM | POA: Insufficient documentation

## 2020-06-14 DIAGNOSIS — Z955 Presence of coronary angioplasty implant and graft: Secondary | ICD-10-CM

## 2020-06-14 LAB — RESEARCH LABS

## 2020-06-14 MED ORDER — ANASTROZOLE 1 MG PO TABS: 1.0000 mg | ORAL_TABLET | Freq: Every day | ORAL | 3 refills | Status: DC

## 2020-06-14 NOTE — Assessment & Plan Note (Signed)
Screening mammogram detected less than 1 cm cluster of calcifications UIQ left breast. Core biopsy showed intermediate grade DCIS ER 100%, PR 100%, Tis NX stage 0 Genetic testing: Negative  Current treatment: COMET study participation, randomized to active surveillance  Cardiac stent placement: Patient has 2 months to undergo surgery if she is randomized to the surgery arm. Patient has been randomized to the active surveillance arm.Return to clinic based on her randomization  Treatment plan: Risk reduction with anastrozole 1 mg daily is recommended.  Tamoxifen would increase her risk of thromboembolic disease and therefore we chose anastrozole instead.  Anastrozole counseling:We discussed the risks and benefits of anti-estrogen therapy with aromatase inhibitors. These include but not limited to insomnia, hot flashes, mood changes, vaginal dryness, bone density loss, and weight gain. We strongly believe that the benefits far outweigh the risks. Patient understands these risks and consented to starting treatment. Planned treatment duration is 5 years.  Return to clinic in 3 months for follow-up

## 2020-06-14 NOTE — Research (Signed)
06/14/2020 at 11:38am - COMET/ AFT-25 study notes- Endocrine Therapy discussion with Dr. Pamelia Hoit- The pt's baseline research blood sample was obtained upon arrival to the cancer center. The pt was into the [facility] alone to meet with Dr. Pamelia Hoit this morning to discuss her assigned randomization arm (Active Monitoring), her endocrine therapy, and her next follow up visit.  The pt had no questions/concerns about her assigned randomization arm. Dr. Pamelia Hoit felt that due to the pt's cardiac history, that anastrozole was the best choice for her endocrine therapy.  The pt agreed to start her anastrozole today.  Dr. Pamelia Hoit will follow up with the pt in 1 month by phone to see how she is tolerating her anastrozole.  The pt is aware that "participants who are completing the follow up questionnaires on paper will be mailed a questionnaire packet within the first week of the scheduled survey date.  The packet will contain the questionnaire and a stamped envelope addressed to the data entry staffat [facility] who will scan and upload a copy of the questionnaire into [employer], and subsequently enter the data into [employer]".  The pt verbalized understanding.  The pt was encouraged to call Dr. Pamelia Hoit with any questions/concerns about her anastrozole.  The pt will see Dr. Pamelia Hoit in 6 months for her first follow up appt with imaging.  The pt 's research blood samples along with her tissue slides will be shipped today.  Site is also working on getting pt's radiology scans ready for submission to [facility].     Janan Ridge RN, BSN, CCRP Clinical Research Nurse 06/14/2020 11:50 AM

## 2020-07-09 ENCOUNTER — Telehealth: Payer: Self-pay | Admitting: Hematology and Oncology

## 2020-07-11 NOTE — Progress Notes (Signed)
Patient Care Team: Imagene Riches, NP as PCP - Philomena Doheny, Paulette Blanch, RN as Oncology Nurse Navigator Rockwell Germany, RN as Oncology Nurse Navigator Nicholas Lose, MD as Medical Oncologist (Hematology and Oncology)  DIAGNOSIS:    ICD-10-CM   1. Ductal carcinoma in situ (DCIS) of left breast  D05.12     SUMMARY OF ONCOLOGIC HISTORY: Oncology History  Ductal carcinoma in situ (DCIS) of left breast  02/13/2020 Initial Diagnosis   Screening mammogram detected less than 1 cm cluster of calcifications UIQ left breast.  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 The report date is 03/25/2020.  The Common Hereditary Cancers Panel offered by [facility] includes sequencing and/or deletion duplication testing of the following 48 genes: APC, ATM, AXIN2, BARD1, BMPR1A, BRCA1, BRCA2, BRIP1, CDH1, CDK4, CDKN2A (p14ARF), CDKN2A (p16INK4a), CHEK2, CTNNA1, DICER1, EPCAM (Deletion/duplication testing only), GREM1 (promoter region deletion/duplication testing only), KIT, MEN1, MLH1, MSH2, MSH3, MSH6, MUTYH, NBN, NF1, NHTL1, PALB2, PDGFRA, PMS2, POLD1, POLE, PTEN, RAD50, RAD51C, RAD51D, RNF43, SDHB, SDHC, SDHD, SMAD4, SMARCA4. STK11, TP53, TSC1, TSC2, and VHL.  The following genes were evaluated for sequence changes only: SDHA and HOXB13 c.251G>A variant only.     CHIEF COMPLIANT: Follow-up of left breast DCIS on COMET clinical trial  INTERVAL HISTORY: Norma Graves is a 69 y.o. with above-mentioned history of left breast DCIS currently in the COMET clinical trial, randomized to the active surveillance arm and currently on antiestrogen  therapy with anastrozole. She presents to the clinic today for follow-up.    She reports that she is tolerating anastrozole reasonably well.  Initially she had lots of hot flashes muscle aches and pains.  But after taking some turmeric and ginger her aches and pains have improved significantly.  Hot flashes are also tolerable.  She was experiencing vaginal dryness symptoms.  ALLERGIES:  has no allergies on file.  MEDICATIONS:  Current Outpatient Medications  Medication Sig Dispense Refill  . acetaminophen (TYLENOL) 325 MG tablet Take 325 mg by mouth every 6 (six) hours as needed.    Marland Kitchen anastrozole (ARIMIDEX) 1 MG tablet Take 1 tablet (1 mg total) by mouth daily. 90 tablet 3  . aspirin 81 MG EC tablet Take 81 mg by mouth every evening.    . calcium carbonate (OS-CAL) 1250 (500 Ca) MG chewable tablet Chew 200 mg by mouth 3 (three) times daily as needed for heartburn. 200 mg calcium (500 mg) chewable tablet    . Cyanocobalamin (VITAMIN B-12 PO) Take 1,000 mcg by mouth daily.    Marland Kitchen ezetimibe (ZETIA) 10 MG tablet Take 10 mg by mouth daily.    Marland Kitchen FLUoxetine (PROZAC) 10 MG capsule Take 10 mg by mouth every other day.    . levothyroxine (SYNTHROID) 50 MCG tablet Take 50 mcg by mouth daily. Daily at 0600    . metoprolol succinate (TOPROL-XL) 25 MG 24 hr tablet Take 25 mg by mouth daily.    . Multiple Vitamin (MULTIVITAMIN) capsule Take 1 capsule by mouth daily.    . nitroGLYCERIN (NITROSTAT) 0.4 MG SL tablet Place 0.4 mg under the tongue every 5 (five) minutes as  needed for chest pain.     No current facility-administered medications for this visit.    PHYSICAL EXAMINATION: ECOG PERFORMANCE STATUS: 1 - Symptomatic but completely ambulatory   ASSESSMENT & PLAN:  Ductal carcinoma in situ (DCIS) of left breast Screening mammogram detected less than 1 cm cluster of calcifications UIQ left breast. Core biopsy showed intermediate grade DCIS ER 100%, PR 100%, Tis NX stage 0 Genetic testing:  Negative  Current treatment:COMET studyparticipation, randomized to active surveillance  Cardiac stent placement:Patient has 2 monthsto undergo surgery if she is randomized to the surgery arm. Patient has been randomized to the active surveillance arm.   Treatment plan: Risk reduction with anastrozole 1 mg daily.   Anastrozole toxicities: 1. Hot flashes: getting better with ginger 2. Joint pains: Improved with Turmeric 3.  Vaginal dryness: We discussed about using coconut oil to prevent dryness.  No clinical evidence of disease recurrence.  She will need a unilateral mammogram 12/01/2019 on her left side. Return to clinic in March after the mammogram on the left side.  No orders of the defined types were placed in this encounter.  The patient has a good understanding of the overall plan. she agrees with it. she will call with any problems that may develop before the next visit here.  Total time spent: 15 mins including face to face time and time spent for planning, charting and coordination of care  Nicholas Lose, MD 07/12/2020  I, Cloyde Reams Dorshimer, am acting as scribe for Dr. Nicholas Lose.  I have reviewed the above documentation for accuracy and completeness, and I agree with the above.

## 2020-07-12 ENCOUNTER — Inpatient Hospital Stay: Payer: Medicare Other | Attending: Hematology and Oncology | Admitting: Hematology and Oncology

## 2020-07-12 ENCOUNTER — Telehealth: Payer: Self-pay | Admitting: Hematology and Oncology

## 2020-07-12 DIAGNOSIS — D0512 Intraductal carcinoma in situ of left breast: Secondary | ICD-10-CM

## 2020-07-12 NOTE — Assessment & Plan Note (Signed)
Screening mammogram detected less than 1 cm cluster of calcifications UIQ left breast. Core biopsy showed intermediate grade DCIS ER 100%, PR 100%, Tis NX stage 0 Genetic testing: Negative  Current treatment:COMET studyparticipation, randomized to active surveillance  Cardiac stent placement:Patient has 2 monthsto undergo surgery if she is randomized to the surgery arm. Patient has been randomized to the active surveillance arm.Return to clinic based on her randomization  Treatment plan: Risk reduction with anastrozole 1 mg daily is recommended.  Tamoxifen would increase her risk of thromboembolic disease and therefore we chose anastrozole instead.  Anastrozole toxicities:  Return to clinic in 1 month for a telephone visit She will need a unilateral mammogram 12/01/2019 on her left side.

## 2020-07-12 NOTE — Telephone Encounter (Signed)
No 10/8 LOS  

## 2020-11-30 NOTE — Progress Notes (Signed)
Patient Care Team: Imagene Riches, NP as PCP - Philomena Doheny, Paulette Blanch, RN as Oncology Nurse Navigator Rockwell Germany, RN as Oncology Nurse Navigator Nicholas Lose, MD as Medical Oncologist (Hematology and Oncology)  DIAGNOSIS:    ICD-10-CM   1. Ductal carcinoma in situ (DCIS) of left breast  D05.12     SUMMARY OF ONCOLOGIC HISTORY: Oncology History  Ductal carcinoma in situ (DCIS) of left breast  02/13/2020 Initial Diagnosis   Screening mammogram detected less than 1 cm cluster of calcifications UIQ left breast.  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 The report date is 03/25/2020.  The Common Hereditary Cancers Panel offered by [facility] includes sequencing and/or deletion duplication testing of the following 48 genes: APC, ATM, AXIN2, BARD1, BMPR1A, BRCA1, BRCA2, BRIP1, CDH1, CDK4, CDKN2A (p14ARF), CDKN2A (p16INK4a), CHEK2, CTNNA1, DICER1, EPCAM (Deletion/duplication testing only), GREM1 (promoter region deletion/duplication testing only), KIT, MEN1, MLH1, MSH2, MSH3, MSH6, MUTYH, NBN, NF1, NHTL1, PALB2, PDGFRA, PMS2, POLD1, POLE, PTEN, RAD50, RAD51C, RAD51D, RNF43, SDHB, SDHC, SDHD, SMAD4, SMARCA4. STK11, TP53, TSC1, TSC2, and VHL.  The following genes were evaluated for sequence changes only: SDHA and HOXB13 c.251G>A variant only.     CHIEF COMPLIANT: Follow-up of left breast DCISonCOMET clinical trial  INTERVAL HISTORY: Norma Graves is a 70 y.o. with above-mentioned history of left breast DCIScurrently in the COMET clinical trial, randomized to the active surveillance arm and currently on antiestrogen  therapy with anastrozole.She presents to the clinic today for follow-up.  She has chronic arthritis and has had some stiffness of her lower extremities.  Overall her symptoms appear to be better than before.  Continues to have mild hot flashes.  She also has mild fatigue.  Her husband had recent mini strokes therefore she is not more anxious about his health.  She is still working full-time.  ALLERGIES:  has no allergies on file.  MEDICATIONS:  Current Outpatient Medications  Medication Sig Dispense Refill  . acetaminophen (TYLENOL) 325 MG tablet Take 325 mg by mouth every 6 (six) hours as needed.    Marland Kitchen anastrozole (ARIMIDEX) 1 MG tablet Take 1 tablet (1 mg total) by mouth daily. 90 tablet 3  . aspirin 81 MG EC tablet Take 81 mg by mouth every evening.    . calcium carbonate (OS-CAL) 1250 (500 Ca) MG chewable tablet Chew 200 mg by mouth 3 (three) times daily as needed for heartburn. 200 mg calcium (500 mg) chewable tablet    . Cyanocobalamin (VITAMIN B-12 PO) Take 1,000 mcg by mouth daily.    Marland Kitchen ezetimibe (ZETIA) 10 MG tablet Take 10 mg by mouth daily.    Marland Kitchen FLUoxetine (PROZAC) 10 MG capsule Take 10 mg by mouth every other day.    . levothyroxine (SYNTHROID) 50 MCG tablet Take 50 mcg by mouth daily. Daily at 0600    . metoprolol succinate (TOPROL-XL) 25 MG 24 hr tablet Take 25 mg by mouth daily.    . Multiple Vitamin (MULTIVITAMIN) capsule Take 1 capsule by mouth daily.    . nitroGLYCERIN (NITROSTAT) 0.4 MG SL tablet Place 0.4 mg under the tongue every 5 (  five) minutes as needed for chest pain.     No current facility-administered medications for this visit.    PHYSICAL EXAMINATION: ECOG PERFORMANCE STATUS: 1 - Symptomatic but completely ambulatory  Vitals:   12/01/20 0852  BP: 132/60  Pulse: (!) 59  Resp: 18  Temp: (!) 97.5 F (36.4 C)  SpO2: 100%   Filed Weights   12/01/20 0852  Weight: 276 lb 11.2 oz (125.5 kg)      ASSESSMENT & PLAN:  Ductal carcinoma in situ (DCIS) of left  breast Screening mammogram detected less than 1 cm cluster of calcifications UIQ left breast. 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   Anastrozole toxicities: 1. Hot flashes: getting better with ginger 2. Joint pains: Stopped turmeric.  Joint pains are slightly better. 3.  Vaginal dryness: Same as before  No clinical evidence of disease recurrence.  Mammogram scheduled for 12/21/20 Return to clinic in September for follow-up   No orders of the defined types were placed in this encounter.  The patient has a good understanding of the overall plan. she agrees with it. she will call with any problems that may develop before the next visit here.  Total time spent: 20 mins including face to face time and time spent for planning, charting and coordination of care  Rulon Eisenmenger, MD, MPH 12/01/2020  I, Cloyde Reams Dorshimer, am acting as scribe for Dr. Nicholas Lose.  I have reviewed the above documentation for accuracy and completeness, and I agree with the above.

## 2020-11-30 NOTE — Assessment & Plan Note (Signed)
Screening mammogram detected less than 1 cm cluster of calcifications UIQ left breast. 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   Anastrozole toxicities: 1. Hot flashes: getting better with ginger 2. Joint pains: Improved with Turmeric 3.  Vaginal dryness: We discussed about using coconut oil to prevent dryness.  No clinical evidence of disease recurrence.  Mammogram scheduled for 12/21/20

## 2020-12-01 ENCOUNTER — Other Ambulatory Visit: Payer: Self-pay | Admitting: *Deleted

## 2020-12-01 ENCOUNTER — Encounter: Payer: Self-pay | Admitting: *Deleted

## 2020-12-01 ENCOUNTER — Inpatient Hospital Stay: Payer: Medicare Other | Attending: Hematology and Oncology | Admitting: Hematology and Oncology

## 2020-12-01 ENCOUNTER — Other Ambulatory Visit: Payer: Self-pay

## 2020-12-01 DIAGNOSIS — R232 Flushing: Secondary | ICD-10-CM | POA: Insufficient documentation

## 2020-12-01 DIAGNOSIS — D0512 Intraductal carcinoma in situ of left breast: Secondary | ICD-10-CM | POA: Insufficient documentation

## 2020-12-01 DIAGNOSIS — Z79899 Other long term (current) drug therapy: Secondary | ICD-10-CM

## 2020-12-01 DIAGNOSIS — Z79811 Long term (current) use of aromatase inhibitors: Secondary | ICD-10-CM | POA: Insufficient documentation

## 2020-12-01 DIAGNOSIS — Z006 Encounter for examination for normal comparison and control in clinical research program: Secondary | ICD-10-CM | POA: Insufficient documentation

## 2020-12-01 DIAGNOSIS — Z7982 Long term (current) use of aspirin: Secondary | ICD-10-CM | POA: Diagnosis not present

## 2020-12-01 DIAGNOSIS — Z170 Estrogen receptor positive status [ER+]: Secondary | ICD-10-CM | POA: Diagnosis not present

## 2020-12-01 NOTE — Research (Signed)
12/01/20 at 10:03am- COMET: Month6 Follow Up visit  H&P:The pt was seen and examined today by Dr. Lindi Adie.   Mammogram:Pt's mammogram is scheduled for 12/21/2020.  Site contacted the study Environmental consultant, [employer], and she confirmed that the pt's visit today and her scheduled imaging would all occur within the respective 4 week time point allowed by the study. Dr. Lindi Adie reassured the pt that he will her review her mammogram results.     Questionnaires:Patient was informed that her paper questionnaires will be mailed to her today.  The pt was encouraged to complete them in a timely manner and mail them in the self addressed envelope provided by the study.     Study Solicited Adverse Events:The research nurse reviewed the Solicited AE list with the pt. The pt iscurrently taking Arimidex 1 mg daily.  She denies any problems taking her medications.  The pt stated that most of her baseline AE's are still ongoing and are tolerable.  She does report new onset of mild "discomfort sensation" to her right breast which is new.  She reports that this feeling has occurred before on her left breast.  Dr. Lindi Adie advised the pt to reduce the amount of aspirin she is taking for this discomfort.  The pt said that this discomfort has "come and gone" over many years.    Please see chart below.  Plan: Patient will be scheduled to return for her next study visit- month12,onSeptember 1st, 2022. Patient was thanked today for her time and continued support of study and was encouraged to call Dr. Lindi Adie or myselfwith any questions or concerns she may have.  Adverse Event Log Study/Protocol:COMET Cycle 6 Month Follow up- Research nurse met with Dr. Lindi Adie on 3/3/2022to obtain attributions regarding the pt's tamoxifen  Event Grade Attribution Comments  Hot flashes 1 Possible "mild hot flashes"  Osteopenia  1 Unrelated Per pt report, has a h/o of osteopenia at baseline  Arthralgia  2 Possible "chronic  arthritis"  Fatigue  1 Unrelated Ongoing since Baseline   Pain- breast 1 Unrelated "New" in Right breast- has occurred in the past per pt report   Vaginal Dryness 1 Possible  Mentioned in 07/12/20 MD note  No other "solicited adverse events" present. Grade 0 AE's: Fever, myalgia, hypertension,nausea, fracture,and allergic reaction.  Not evaluated Solicited AE's include: Acute Coronary Syndrome, Ischemia Cerebrovascular, and Cholesterol  Brion Aliment RN, BSN, CCRP Clinical Research Nurse Lead 12/01/2020 10:44 AM

## 2020-12-21 ENCOUNTER — Other Ambulatory Visit: Payer: Self-pay | Admitting: Hematology and Oncology

## 2020-12-21 ENCOUNTER — Other Ambulatory Visit: Payer: Self-pay

## 2020-12-21 ENCOUNTER — Ambulatory Visit
Admission: RE | Admit: 2020-12-21 | Discharge: 2020-12-21 | Disposition: A | Discharge status: Home / Self Care | Payer: Medicare Other | Source: Ambulatory Visit | Attending: Hematology and Oncology | Admitting: Hematology and Oncology

## 2020-12-21 DIAGNOSIS — D0512 Intraductal carcinoma in situ of left breast: Secondary | ICD-10-CM

## 2020-12-21 DIAGNOSIS — R921 Mammographic calcification found on diagnostic imaging of breast: Secondary | ICD-10-CM

## 2020-12-27 ENCOUNTER — Ambulatory Visit
Admission: RE | Admit: 2020-12-27 | Discharge: 2020-12-27 | Disposition: A | Discharge status: Home / Self Care | Payer: Medicare Other | Source: Ambulatory Visit | Attending: Hematology and Oncology | Admitting: Hematology and Oncology

## 2020-12-27 ENCOUNTER — Other Ambulatory Visit: Payer: Self-pay

## 2020-12-27 DIAGNOSIS — R921 Mammographic calcification found on diagnostic imaging of breast: Secondary | ICD-10-CM

## 2020-12-29 ENCOUNTER — Telehealth: Payer: Self-pay | Admitting: Hematology and Oncology

## 2020-12-29 NOTE — Telephone Encounter (Signed)
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 It may present as a mass or thickening on exam and commonly found on mammograms as a well-defined mass. Excisional biopsy should be performed for most of these cases. However if was diagnosed without any mass then surgical excision is not necessary. There is no increased risk of subsequent breast cancer associated with PASH   Plan: She has an appointment to see surgery to talk about resection. I will discuss with the surgery team about surgery versus surveillance. 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

## 2020-12-31 ENCOUNTER — Telehealth: Payer: Self-pay | Admitting: *Deleted

## 2020-12-31 NOTE — Telephone Encounter (Signed)
12/31/20 at 3:08pm- COMET follow up call - The research nurse called the pt to discuss her upcoming surgeon's appt to discuss her recent biopsy results from 12/27/20.  The pt confirmed that she is scheduled to see Dr. Luisa Hart for a consultation appt on 01/31/21.  The pt said that Dr. Pamelia Hoit called her this week to go over the results of her pathology report.  The pt said that she will let Dr. Pamelia Hoit and her surgeon decide if she needs to have surgery.  The pt was told that if her medical team feels that it is in her best interest to proceed with surgery, then she still has the option to remain in the study and complete her questionnaires.  The pt said that she would like to continue her participation even if she elects to have surgery.  The pt was thanked for her support of this clinical trial.  The research nurse will continue to follow the pt's appointments.  The pt was encouraged to call the research nurse if she has any questions.  Janan Ridge RN, BSN, CCRP Clinical Research Nurse Lead 12/31/2020 3:15 PM

## 2021-01-27 ENCOUNTER — Telehealth: Payer: Self-pay | Admitting: *Deleted

## 2021-01-27 NOTE — Telephone Encounter (Signed)
Received VM from pt.  Attempt x1 to return call, no answer.  Lvm to return call to the office.

## 2021-05-04 ENCOUNTER — Other Ambulatory Visit: Payer: Self-pay | Admitting: Hematology and Oncology

## 2021-05-04 DIAGNOSIS — D0512 Intraductal carcinoma in situ of left breast: Secondary | ICD-10-CM

## 2021-06-02 ENCOUNTER — Inpatient Hospital Stay: Payer: Medicare Other | Admitting: Hematology and Oncology

## 2021-06-02 ENCOUNTER — Inpatient Hospital Stay: Payer: Medicare Other

## 2021-06-02 NOTE — Assessment & Plan Note (Deleted)
Screening mammogram detected less than 1 cm cluster of calcifications UIQ left breast. 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  Anastrozoletoxicities: 1. Hot flashes: getting better with ginger 2. Joint pains: Stopped turmeric.  Joint pains are slightly better. 3.Vaginal dryness: Same as before  Breast cancer surveillance: 1.  Mammogram 12/21/2020 bilateral: Indeterminate 10 mm group of fine heterogeneous calcifications LOQ left breast 4 o'clock position.  No evidence of residual calcifications in the area of biopsy-proven DCIS LIQ left breast.  Right breast benign, biopsy: CSL, PASH 2. breast exam 06/02/2021: Benign  No clinical evidence of disease recurrence.  Return to clinic in 6 months for follow-up after mammograms

## 2021-06-08 ENCOUNTER — Telehealth: Payer: Self-pay | Admitting: *Deleted

## 2021-06-08 ENCOUNTER — Ambulatory Visit: Payer: Medicare Other | Admitting: Hematology and Oncology

## 2021-06-08 ENCOUNTER — Other Ambulatory Visit: Payer: Medicare Other

## 2021-06-08 ENCOUNTER — Telehealth: Payer: Self-pay | Admitting: Hematology and Oncology

## 2021-06-08 NOTE — Telephone Encounter (Signed)
Received call from pt stating she tested positive for Covid 19 on Monday 06/06/21.  Pt states only current symptoms include fatigue and headache.  RN sent high priority message to scheduling to push out lab and MD apt by 21 days.  Pt educated to call office if symptoms worsen.  Pt verbalized understanding and appreciative of advice.

## 2021-06-08 NOTE — Assessment & Plan Note (Deleted)
Screening mammogram detected less than 1 cm cluster of calcifications UIQ left breast. 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  Anastrozoletoxicities: 1. Hot flashes: getting better with ginger 2. Joint pains: Stopped turmeric.  Joint pains are slightly better. 3.Vaginal dryness: Same as before  No clinical evidence of disease recurrence.  Mammogram 12/21/20:  Indeterminate 10 mm group of calcs LOQ left breast 4 o clock. Prior DCIS area: no residual calcs. Biopsy: CSL with [facility], PASH   Return to clinic in 6 months

## 2021-06-08 NOTE — Telephone Encounter (Signed)
Scheduled per sch msg. Called and spoke with patient. Confirmed appt  

## 2021-06-09 ENCOUNTER — Other Ambulatory Visit: Payer: Self-pay | Admitting: Hematology and Oncology

## 2021-06-14 ENCOUNTER — Other Ambulatory Visit: Payer: Self-pay | Admitting: Hematology and Oncology

## 2021-06-25 NOTE — Progress Notes (Signed)
Patient Care Team: Imagene Riches, NP as PCP - Philomena Doheny, Paulette Blanch, RN as Oncology Nurse Navigator Rockwell Germany, RN as Oncology Nurse Navigator Nicholas Lose, MD as Medical Oncologist (Hematology and Oncology)  DIAGNOSIS:    ICD-10-CM   1. Ductal carcinoma in situ (DCIS) of left breast  D05.12       SUMMARY OF ONCOLOGIC HISTORY: Oncology History  Ductal carcinoma in situ (DCIS) of left breast  02/13/2020 Initial Diagnosis   Screening mammogram detected less than 1 cm cluster of calcifications UIQ left breast.  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 The report date is 03/25/2020.  The Common Hereditary Cancers Panel offered by [facility] includes sequencing and/or deletion duplication testing of the following 48 genes: APC, ATM, AXIN2, BARD1, BMPR1A, BRCA1, BRCA2, BRIP1, CDH1, CDK4, CDKN2A (p14ARF), CDKN2A (p16INK4a), CHEK2, CTNNA1, DICER1, EPCAM (Deletion/duplication testing only), GREM1 (promoter region deletion/duplication testing only), KIT, MEN1, MLH1, MSH2, MSH3, MSH6, MUTYH, NBN, NF1, NHTL1, PALB2, PDGFRA, PMS2, POLD1, POLE, PTEN, RAD50, RAD51C, RAD51D, RNF43, SDHB, SDHC, SDHD, SMAD4, SMARCA4. STK11, TP53, TSC1, TSC2, and VHL.  The following genes were evaluated for sequence changes only: SDHA and HOXB13 c.251G>A variant only.     CHIEF COMPLIANT: Follow-up of left breast DCIS on COMET clinical trial  INTERVAL HISTORY: Norma Graves is a 70 y.o. with above-mentioned history of left breast DCIS currently in the COMET clinical trial, randomized to the active surveillance arm and currently on antiestrogen  therapy with anastrozole. She presents to the clinic today for follow-up.  Patient had COVID infection and therefore she canceled her previous appointment.  She is feeling a lot better.  Her husband after COVID infection stroke and had to be hospitalized.  He is currently undergoing physical therapy.  ALLERGIES:  has no allergies on file.  MEDICATIONS:  Current Outpatient Medications  Medication Sig Dispense Refill   acetaminophen (TYLENOL) 325 MG tablet Take 325 mg by mouth every 6 (six) hours as needed.     anastrozole (ARIMIDEX) 1 MG tablet Take 1 tablet by mouth once daily 90 tablet 0   aspirin 81 MG EC tablet Take 81 mg by mouth every evening.     calcium carbonate (OS-CAL) 1250 (500 Ca) MG chewable tablet Chew 200 mg by mouth 3 (three) times daily as needed for heartburn. 200 mg calcium (500 mg) chewable tablet     Cyanocobalamin (VITAMIN B-12 PO) Take 1,000 mcg by mouth daily.     ezetimibe (ZETIA) 10 MG tablet Take 10 mg by mouth daily.     FLUoxetine (PROZAC) 10 MG capsule Take 10 mg by mouth every other day.     levothyroxine (SYNTHROID) 50 MCG tablet Take 50 mcg by mouth daily. Daily at 0600     metoprolol succinate (TOPROL-XL) 25 MG 24 hr tablet Take 25 mg by mouth daily.     Multiple Vitamin (MULTIVITAMIN) capsule Take 1 capsule by mouth daily.     nitroGLYCERIN (NITROSTAT) 0.4 MG SL tablet Place 0.4 mg under the tongue every 5 (five) minutes as needed for chest pain.     No current facility-administered medications for this  visit.    PHYSICAL EXAMINATION: ECOG PERFORMANCE STATUS: 1 - Symptomatic but completely ambulatory  Vitals:   06/27/21 0907  BP: 135/67  Pulse: 75  Resp: 18  Temp: (!) 97.5 F (36.4 C)  SpO2: 99%   Filed Weights   06/27/21 0907  Weight: 270 lb 14.4 oz (122.9 kg)    BREAST: No palpable masses or nodules in either right or left breasts. No palpable axillary supraclavicular or infraclavicular adenopathy no breast tenderness or nipple discharge.  (exam performed in the presence of a chaperone)  ASSESSMENT & PLAN:  Ductal carcinoma in situ (DCIS) of left breast Screening mammogram detected less than 1 cm cluster of calcifications UIQ left breast.  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   Anastrozole toxicities: 1. Hot flashes: getting better with ginger 2. Joint pains: Stopped turmeric.  Joint pains are slightly better. 3.  Vaginal dryness: Same as before  Breast cancer surveillance: 1.  Mammogram 12/21/2020: Indeterminate 1 cm group of fine calcifications LOQ left breast 4:00, no evidence of residual calcifications at the site of biopsy-proven DCIS: Stereotactic biopsy: PASH I ordered a bilateral mammograms to be done in the next couple weeks.  She will also come back in 6 months with and mammogram on the left breast. 2. breast exam 06/27/2021: Benign    No orders of the defined types were placed in this encounter.  The patient has a good understanding of the overall plan. she agrees with it. she will call with any problems that may develop before the next visit here.  Total time spent: 30 mins including face to face time and time spent for planning, charting and coordination of care  Rulon Eisenmenger, MD, MPH 06/27/2021  I, Thana Ates, am acting as scribe for Dr. Nicholas Lose.  I have reviewed the above documentation for accuracy and completeness, and I agree with the above.

## 2021-06-27 ENCOUNTER — Encounter: Payer: Self-pay | Admitting: *Deleted

## 2021-06-27 ENCOUNTER — Inpatient Hospital Stay: Payer: Medicare Other | Attending: Hematology and Oncology

## 2021-06-27 ENCOUNTER — Other Ambulatory Visit: Payer: Self-pay

## 2021-06-27 ENCOUNTER — Inpatient Hospital Stay (HOSPITAL_BASED_OUTPATIENT_CLINIC_OR_DEPARTMENT_OTHER): Payer: Medicare Other | Admitting: Hematology and Oncology

## 2021-06-27 DIAGNOSIS — D0512 Intraductal carcinoma in situ of left breast: Secondary | ICD-10-CM

## 2021-06-27 DIAGNOSIS — Z006 Encounter for examination for normal comparison and control in clinical research program: Secondary | ICD-10-CM | POA: Insufficient documentation

## 2021-06-27 LAB — RESEARCH LABS

## 2021-06-27 NOTE — Assessment & Plan Note (Signed)
Screening mammogram detected less than 1 cm cluster of calcifications UIQ left breast. 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  Anastrozoletoxicities: 1. Hot flashes: getting better with ginger 2. Joint pains: Stopped turmeric.  Joint pains are slightly better. 3.Vaginal dryness: Same as before  Breast cancer surveillance: 1.  Mammogram 12/21/2020: Indeterminate 1 cm group of fine calcifications LOQ left breast 4:00, no evidence of residual calcifications at the site of biopsy-proven DCIS: Stereotactic biopsy: PASH 2. breast exam 06/27/2021: Benign

## 2021-06-27 NOTE — Research (Signed)
AFT - 25: COMPARING AN OPERATION TO MONITORING, WITH OR WITHOUT ENDOCRINE THERAPY (COMET) FOR LOW RISK DCIS: A PHASE III PROSPECTIVE RANDOMIZED TRIAL   Patient arrives today at 9 am unaccompanied for her month 12 study visit.    PROs: Per study protocol, all PROs required for this visit were completed prior to today's visit.  The pt completes her questionnaires on paper.  The pt completed her questionnaires on 06/03/21. The pt was thanked for completing her questionnaires in a timely manner.    LABS: Mandatory month 12 study labs were collected per consent and study protocol: Patient Norma Graves tolerated well without complaint.   MEDICATION REVIEW: Patient reviews and verifies the current medication list is correct.  The pt confirmed that she is taking her anastrozole daily.    VITAL SIGNS: Vital signs are collected per study protocol.  MD/PROVIDER VISIT: Patient sees Dr. Lindi Adie for today's visit.  He completed a physical exam including a breast exam today.  Dr. Lindi Adie states "no palpable masses or nodules in either right or left breast". The pt's bilateral mammogram was delayed to the pt's recent COVID-19 infection.  The mammogram was rescheduled for 07/28/21. The pt will attempt to get this scan done sooner.  Will report accordingly if this scan is not within window for this month 12 assessment.     ADVERSE EVENTS: Patient Norma Graves reports she has recovered almost from her COVID-19 infection. She states she had the omicron variant along with her husband. The pt reported a mild fever (06/06/21-06/08/21) and some palpitations (06/06/21- ongoing) that was directly related to her COVID infection. She also reported mild headache along with fatigue that started on 06/06/21.  Her headache has resolved, but her mild fatigue is ongoing. The pt reported the following AE's as ongoing: arthralgia, hot flashes, and vaginal dryness.  The pt states her arthralgia is mild now, and the tumeric has helped lessen her  arthralgia pain.  The pt said that her hot flashes are "mild to moderate" (grade 2).  Her vaginal dryness has remained the same since her last visit. The research nurse met with Dr. Lindi Adie about her AE's, and he provided attributions related to her anastrozole.  He signed/dated the Solicited AE form.   ADVERSE EVENT LOGLanise Mergen [id number]  06/27/2021  Adverse Event Log  Study/Protocol: [facility]/AFT-25 Cycle: month 12   Event Grade Onset Date Resolved Date Drug Name Attribution Treatment Comments  arthralgia Grade 1 Baseline  Ongoing Anastrozole  Possible  Pt uses tumeric with good relief  Fever  Grade 1 06/06/21 06/08/21 Anastrozole Unrelated  Related to COVID  Hot flashes  Grade 2 Baseline Ongoing  Anastrozole Probably    Vaginal  dryness Grade 1 12/01/20 Ongoing Anastrozole  Possible  Uses coconut oil   Palpitations  Grade 1 06/06/21 Ongoing Anastrozole   Unrelated  Was told by MD that it is related to her COVID.   Respiratory infection  Grade 2 06/06/21 06/27/21 Anastrozole Unrelated    Fatigue  Grade 1  06/06/21 Ongoing Anastrozole Unrelated    Headache Grade 1  06/06/21 06/27/21 Anastrozole Unrelated    Grade 0 AE's:  myalgia, hypertension, nausea, fracture, and allergic reaction.   Not evaluated Solicited AE's include:  Acute Coronary Syndrome, Ischemia Cerebrovascular, Osteoporosis, and Cholesterol   DISPOSITION: Upon completion off all study requirements, patient was escorted to the scheduler to have her future appointments scheduled.   The patient was thanked for their time and continued voluntary participation in this study.  Patient Norma Graves has been provided direct contact information and is encouraged to contact this Nurse for any needs or questions.  Brion Aliment RN, BSN, CCRP Clinical Research Nurse Lead 06/27/2021 11:16 AM

## 2021-07-04 ENCOUNTER — Telehealth: Payer: Self-pay | Admitting: *Deleted

## 2021-07-28 ENCOUNTER — Ambulatory Visit
Admission: RE | Admit: 2021-07-28 | Discharge: 2021-07-28 | Disposition: A | Discharge status: Home / Self Care | Payer: Medicare Other | Source: Ambulatory Visit | Attending: Hematology and Oncology | Admitting: Hematology and Oncology

## 2021-07-28 ENCOUNTER — Other Ambulatory Visit: Payer: Self-pay

## 2021-07-28 DIAGNOSIS — D0512 Intraductal carcinoma in situ of left breast: Secondary | ICD-10-CM

## 2021-12-16 ENCOUNTER — Other Ambulatory Visit: Payer: Self-pay | Admitting: Hematology and Oncology

## 2021-12-16 DIAGNOSIS — D0512 Intraductal carcinoma in situ of left breast: Secondary | ICD-10-CM

## 2021-12-22 NOTE — Assessment & Plan Note (Addendum)
Screening mammogram detected less than 1 cm cluster of calcifications UIQ left breast. 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 ?Current treatment:?COMET study?participation, randomized to active surveillance ?? ?Treatment plan: Risk reduction with anastrozole 1 mg daily. ?? ?Anastrozole?toxicities: ?1. Hot flashes: getting better with ginger ?2. Joint pains: turmeric is helping her joint stiffness and aches ?3.??Vaginal dryness:?Same as before ?? ?Breast cancer surveillance: ?1.  Mammogram 07/28/2021: No mammographic evidence of malignancy in either breast, breast density category B.  Next mammogram to be done in April. ?2. breast exam 12/23/2021: Benign, slight swelling of the left breast but no palpable lumps or nodules ? ?Her husband had a couple of mini strokes and she has been worried about him. ? ?15-month mammogram will be obtained for Comet clinical trial ?

## 2021-12-22 NOTE — Progress Notes (Signed)
? ?Patient Care Team: ?Imagene Riches, NP as PCP - General ?Mauro Kaufmann, RN as Oncology Nurse Navigator ?Rockwell Germany, RN as Oncology Nurse Navigator ?Nicholas Lose, MD as Medical Oncologist (Hematology and Oncology) ? ?DIAGNOSIS:  ?Encounter Diagnosis  ?Name Primary?  ? Ductal carcinoma in situ (DCIS) of left breast   ? ? ?SUMMARY OF ONCOLOGIC HISTORY: ?Oncology History  ?Ductal carcinoma in situ (DCIS) of left breast  ?02/13/2020 Initial Diagnosis  ? Screening mammogram detected less than 1 cm cluster of calcifications UIQ left breast.  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  ?03/02/2020 Cancer Staging  ? Staging form: Breast, AJCC 8th Edition ?- Clinical stage from 03/02/2020: Stage 0 (cTis (DCIS), cN0, cM0, ER+, PR+, HER2: Not Assessed) - Signed by Nicholas Lose, MD on 03/02/2020 ? ?  ?03/25/2020 Genetic Testing  ? Negative genetic testing:  No pathogenic variants detected on the [facility] Common Hereditary Cancers Panel. The report date is 03/25/2020. ? ?The Common Hereditary Cancers Panel offered by [facility] includes sequencing and/or deletion duplication testing of the following 48 genes: APC, ATM, AXIN2, BARD1, BMPR1A, BRCA1, BRCA2, BRIP1, CDH1, CDK4, CDKN2A (p14ARF), CDKN2A (p16INK4a), CHEK2, CTNNA1, DICER1, EPCAM (Deletion/duplication testing only), GREM1 (promoter region deletion/duplication testing only), KIT, MEN1, MLH1, MSH2, MSH3, MSH6, MUTYH, NBN, NF1, NHTL1, PALB2, PDGFRA, PMS2, POLD1, POLE, PTEN, RAD50, RAD51C, RAD51D, RNF43, SDHB, SDHC, SDHD, SMAD4, SMARCA4. STK11, TP53, TSC1, TSC2, and VHL.  The following genes were evaluated for sequence changes only: SDHA and HOXB13 c.251G>A variant only. ?  ? ? ?CHIEF COMPLIANT: Follow-up of left breast DCIS on COMET clinical trial ? ?INTERVAL HISTORY: Norma Graves is a  71 y.o. with above-mentioned history of left breast DCIS currently in the COMET clinical trial, randomized to the active surveillance arm and currently  on antiestrogen therapy with anastrozole. She presents to the clinic today for follow-up. She is tolerating the anastrozole. She complains of discomfort on her right side. She complains of swelling in her breast.  ? ? ?ALLERGIES:  has no allergies on file. ? ?MEDICATIONS:  ?Current Outpatient Medications  ?Medication Sig Dispense Refill  ? azelastine (ASTELIN) 0.1 % nasal spray Place 1 spray into both nostrils 2 (two) times daily. Use in each nostril as directed 30 mL 12  ? promethazine-dextromethorphan (PROMETHAZINE-DM) 6.25-15 MG/5ML syrup Take 5 mLs by mouth 4 (four) times daily as needed for cough. 118 mL 0  ? acetaminophen (TYLENOL) 325 MG tablet Take 325 mg by mouth every 6 (six) hours as needed.    ? anastrozole (ARIMIDEX) 1 MG tablet Take 1 tablet (1 mg total) by mouth daily. 90 tablet 3  ? aspirin 81 MG EC tablet Take 81 mg by mouth every evening.    ? calcium carbonate (OS-CAL) 1250 (500 Ca) MG chewable tablet Chew 200 mg by mouth 3 (three) times daily as needed for heartburn. 200 mg calcium (500 mg) chewable tablet    ? Cyanocobalamin (VITAMIN B-12 PO) Take 1,000 mcg by mouth daily.    ? ezetimibe (ZETIA) 10 MG tablet Take 10 mg by mouth daily.    ? FLUoxetine (PROZAC) 10 MG capsule Take 10 mg by mouth every other day.    ? levothyroxine (SYNTHROID) 50 MCG tablet Take 50 mcg by mouth daily. Daily at 0600    ? metoprolol succinate (TOPROL-XL) 25 MG 24 hr tablet Take 25 mg by mouth daily.    ? Multiple Vitamin (MULTIVITAMIN) capsule Take 1 capsule by mouth  daily.    ? nitroGLYCERIN (NITROSTAT) 0.4 MG SL tablet Place 0.4 mg under the tongue every 5 (five) minutes as needed for chest pain.    ? ?No current facility-administered medications for this visit.  ? ? ?PHYSICAL EXAMINATION: ?ECOG PERFORMANCE STATUS: 1 - Symptomatic but completely ambulatory ? ?Vitals:  ? 12/23/21 0858  ?BP: (!) 148/66  ?Pulse: 87  ?Resp: 18  ?Temp: 97.9 ?F (36.6 ?C)  ?SpO2: 100%  ? ?Filed Weights  ? 12/23/21 0858  ?Weight: 268 lb 3.2  oz (121.7 kg)  ? ? ?BREAST: No palpable masses or nodules in either right or left breasts. No palpable axillary supraclavicular or infraclavicular adenopathy no breast tenderness or nipple discharge. (exam performed in the presence of a chaperone) ? ?LABORATORY DATA:  ?I have reviewed the data as listed ?   ? View : No data to display.  ?  ?  ?  ? ? ?No results found for: WBC, HGB, HCT, MCV, PLT, NEUTROABS ? ?ASSESSMENT & PLAN:  ?Ductal carcinoma in situ (DCIS) of left breast ?Screening mammogram detected less than 1 cm cluster of calcifications UIQ left breast.  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  ?Current treatment: COMET study participation, randomized to active surveillance ?  ?Treatment plan: Risk reduction with anastrozole 1 mg daily. ?  ?Anastrozole toxicities: ?1. Hot flashes: getting better with ginger ?2. Joint pains: turmeric is helping her joint stiffness and aches ?3.  Vaginal dryness: Same as before ?  ?Breast cancer surveillance: ?1.  Mammogram 07/28/2021: No mammographic evidence of malignancy in either breast, breast density category B.  Next mammogram to be done in April. ?2. breast exam 12/23/2021: Benign, slight swelling of the left breast but no palpable lumps or nodules ? ?Her husband had a couple of mini strokes and she has been worried about him. ? ?20-monthmammogram will be obtained for Comet clinical trial ? ? ? ?No orders of the defined types were placed in this encounter. ? ?The patient has a good understanding of the overall plan. she agrees with it. she will call with any problems that may develop before the next visit here. ?Total time spent: 30 mins including face to face time and time spent for planning, charting and co-ordination of care ? ? VHarriette Ohara MD ?12/23/21 ? ? ? I DGardiner Coinsam scribing for Dr. GLindi Adie? ?I have reviewed the above documentation for accuracy and completeness, and I agree with the  above. ?  ?

## 2021-12-23 ENCOUNTER — Other Ambulatory Visit: Payer: Self-pay

## 2021-12-23 ENCOUNTER — Encounter: Payer: Self-pay | Admitting: *Deleted

## 2021-12-23 ENCOUNTER — Inpatient Hospital Stay: Payer: Medicare HMO | Attending: Hematology and Oncology | Admitting: Hematology and Oncology

## 2021-12-23 DIAGNOSIS — D0512 Intraductal carcinoma in situ of left breast: Secondary | ICD-10-CM

## 2021-12-23 DIAGNOSIS — Z006 Encounter for examination for normal comparison and control in clinical research program: Secondary | ICD-10-CM | POA: Diagnosis present

## 2021-12-23 DIAGNOSIS — Z170 Estrogen receptor positive status [ER+]: Secondary | ICD-10-CM | POA: Insufficient documentation

## 2021-12-23 DIAGNOSIS — Z79811 Long term (current) use of aromatase inhibitors: Secondary | ICD-10-CM | POA: Insufficient documentation

## 2021-12-23 MED ORDER — AZELASTINE HCL 0.1 % NA SOLN: 1.0000 | Freq: Two times a day (BID) | NASAL | 12 refills | Status: AC

## 2021-12-23 MED ORDER — PROMETHAZINE-DM 6.25-15 MG/5ML PO SYRP: 5.0000 mL | ORAL_SOLUTION | Freq: Four times a day (QID) | ORAL | 0 refills | Status: DC | PRN

## 2021-12-23 MED ORDER — ANASTROZOLE 1 MG PO TABS
1.0000 mg | ORAL_TABLET | Freq: Every day | ORAL | 3 refills | Status: DC
Start: 2021-12-23 — End: 2023-01-01

## 2021-12-23 NOTE — Research (Signed)
AFT - 25: COMPARING AN OPERATION TO MONITORING, WITH OR WITHOUT ENDOCRINE THERAPY (COMET) FOR LOW RISK DCIS: A PHASE III PROSPECTIVE RANDOMIZED TRIAL  ?Patient arrived today at 9 am unaccompanied for her month 18 study visit.  ?  ?PROs: Per study protocol, questionnaires are not due at this time point.   ? ?LABS: Per study protocol, research samples are not due at this time point.  ?  ?MEDICATION REVIEW: Patient reviews and verifies the current medication list is correct.  The pt confirmed that she is taking her anastrozole daily.   ?  ?VITAL SIGNS: Vital signs are collected per study protocol. ?  ?MD/PROVIDER VISIT: Patient sees Dr. Lindi Adie for today's visit.  He completed a physical exam including a breast exam today.  Dr. Lindi Adie states "no palpable masses or nodules in either right or left breast". The pt's month 12 mammogram was delayed due to her COVID-19 infection.  Her mammogram was done on 07/28/21.  The study confirmed that all future mammograms need to be scheduled from the last mammogram.  Therefore, the pt's unilateral mammogram is scheduled to be done 6 months from her last mammogram on 01/27/22.   ?  ?ADVERSE EVENTS: Patient Norma Graves reports she has recovered fully from COVID-19 infection from last fall 2022. She states she had the omicron variant along with her husband. The pt denies fever and palpitations. She reports a mild, intermittent headache (grade 1).  She states that she has intermittent mild breast pain (grade 1)  which feels "swollen/tight" to her on occasion.  She denies any fatigue.  The pt reported the following AE's as ongoing: arthralgia, hot flashes, and vaginal dryness.  The pt states her arthralgia is moderate now (grade 2), and the tumeric and ginger has helped lessen her arthralgia pain some.  She did report increased left knee pain since her last follow up visit.  The pt reported having an injection to relieve her knee pain.  The pt said that her hot flashes are "mild to  moderate" (grade 2).  Her vaginal dryness (grade 1) has remained the same since her last visit. The pt developed sinusitis in December, and she reported using antibiotics/steroids for her infection. She is currently using OTC medications to manage her sinus symptoms prn. The research nurse met with Dr. Lindi Adie about her AE's, and he provided attributions related to her anastrozole.  He signed/dated the Solicited AE form today.  ?  ?ADVERSE EVENT LOG:  ?Norma Graves ?[id number] ?  ?12/23/2021 ?  ?Adverse Event Log ?  ?Study/Protocol: Comet/AFT-25 ?Cycle: month 18  ?  ?Event Grade Onset Date Resolved Date Drug Name Attribution Treatment Comments  ?arthralgia Grade 2 Baseline  Ongoing Anastrozole  Possible  had injection left knee Pt uses tumeric and ginger with some relief  ?Hot flashes  Grade 2 Baseline Ongoing  Anastrozole Probable    stable   ?Vaginal  ?dryness Grade 1 12/01/20 Ongoing Anastrozole  Possible   Uses coconut oil   ?Sinusitis  Grade 2 Dec.2022 Resolved  Anastrozole Unrelated  took antibiotics and steroids  uses OTC sinus meds prn  ?Breast pain Grade 1  12/23/21 Ongoing  Anastrozole Unrelated  none   mild  ?Headache Grade 1  12/23/21 Ongoing  Anastrozole Unrelated  none   mild   ?Grade 0 AE's:  fever, myalgia, hypertension, nausea, fracture, and allergic reaction.   ?Not evaluated Solicited AE's include:  Acute Coronary Syndrome, Ischemia Cerebrovascular, Osteoporosis (pt denies any recent Dexa scans), and Cholesterol (pt denies  any recent lipid panels) ?  ?DISPOSITION: Upon completion off all study requirements, patient was escorted to the scheduler to have her future appointments scheduled for 06/26/22. Pt was informed that her research samples will be drawn at her next study visit.  The pt verbalized understanding.  ?  ?The patient was thanked for their time and continued voluntary participation in this study. Patient Norma Graves has been provided direct contact information and is encouraged to contact  this Nurse for any needs or questions. ? ?Brion Aliment RN, BSN, CCRP ?Clinical Research Nurse Lead ?12/23/2021 11:54 AM   ?  ?

## 2022-01-27 ENCOUNTER — Other Ambulatory Visit: Payer: Self-pay | Admitting: Hematology and Oncology

## 2022-01-27 ENCOUNTER — Ambulatory Visit
Admission: RE | Admit: 2022-01-27 | Discharge: 2022-01-27 | Disposition: A | Discharge status: Home / Self Care | Payer: Medicare HMO | Source: Ambulatory Visit | Attending: Hematology and Oncology | Admitting: Hematology and Oncology

## 2022-01-27 DIAGNOSIS — D0512 Intraductal carcinoma in situ of left breast: Secondary | ICD-10-CM

## 2022-06-23 NOTE — Progress Notes (Signed)
Patient Care Team: Rhea Bleacher, NP as PCP - Philomena Doheny, Paulette Blanch, RN as Oncology Nurse Navigator Rockwell Germany, RN as Oncology Nurse Navigator Nicholas Lose, MD as Medical Oncologist (Hematology and Oncology)  DIAGNOSIS: No diagnosis found.  SUMMARY OF ONCOLOGIC HISTORY: Oncology History  Ductal carcinoma in situ (DCIS) of left breast  02/13/2020 Initial Diagnosis   Screening mammogram detected less than 1 cm cluster of calcifications UIQ left breast.  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 The report date is 03/25/2020.  The Common Hereditary Cancers Panel offered by [facility] includes sequencing and/or deletion duplication testing of the following 48 genes: APC, ATM, AXIN2, BARD1, BMPR1A, BRCA1, BRCA2, BRIP1, CDH1, CDK4, CDKN2A (p14ARF), CDKN2A (p16INK4a), CHEK2, CTNNA1, DICER1, EPCAM (Deletion/duplication testing only), GREM1 (promoter region deletion/duplication testing only), KIT, MEN1, MLH1, MSH2, MSH3, MSH6, MUTYH, NBN, NF1, NHTL1, PALB2, PDGFRA, PMS2, POLD1, POLE, PTEN, RAD50, RAD51C, RAD51D, RNF43, SDHB, SDHC, SDHD, SMAD4, SMARCA4. STK11, TP53, TSC1, TSC2, and VHL.  The following genes were evaluated for sequence changes only: SDHA and HOXB13 c.251G>A variant only.     CHIEF COMPLIANT: Follow-up of left breast DCIS on COMET clinical trial    INTERVAL HISTORY: Norma Graves is a 71 y.o. with above-mentioned history of left breast DCIS currently in the COMET clinical trial, randomized to the active surveillance arm and currently on antiestrogen therapy with anastrozole. She presents to the clinic  today for follow-up. She is tolerating the anastrozole   ALLERGIES:  has no allergies on file.  MEDICATIONS:  Current Outpatient Medications  Medication Sig Dispense Refill   acetaminophen (TYLENOL) 325 MG tablet Take 325 mg by mouth every 6 (six) hours as needed.     anastrozole (ARIMIDEX) 1 MG tablet Take 1 tablet (1 mg total) by mouth daily. 90 tablet 3   aspirin 81 MG EC tablet Take 81 mg by mouth every evening.     azelastine (ASTELIN) 0.1 % nasal spray Place 1 spray into both nostrils 2 (two) times daily. Use in each nostril as directed 30 mL 12   calcium carbonate (OS-CAL) 1250 (500 Ca) MG chewable tablet Chew 200 mg by mouth 3 (three) times daily as needed for heartburn. 200 mg calcium (500 mg) chewable tablet     Cyanocobalamin (VITAMIN B-12 PO) Take 1,000 mcg by mouth daily.     ezetimibe (ZETIA) 10 MG tablet Take 10 mg by mouth daily.     FLUoxetine (PROZAC) 10 MG capsule Take 10 mg by mouth every other day.     levothyroxine (SYNTHROID) 50 MCG tablet Take 50 mcg by mouth daily. Daily at 0600     metoprolol succinate (TOPROL-XL) 25 MG 24 hr tablet Take 25 mg by mouth daily.     Multiple Vitamin (MULTIVITAMIN) capsule Take 1 capsule by mouth daily.     nitroGLYCERIN (NITROSTAT) 0.4 MG SL tablet Place 0.4 mg under the tongue every 5 (five) minutes as needed for chest pain.     promethazine-dextromethorphan (PROMETHAZINE-DM) 6.25-15 MG/5ML syrup Take 5 mLs by mouth 4 (four) times daily as needed for cough. 118 mL 0   No current  facility-administered medications for this visit.    PHYSICAL EXAMINATION: ECOG PERFORMANCE STATUS: {CHL ONC ECOG PS:[id number]}  There were no vitals filed for this visit. There were no vitals filed for this visit.  BREAST:*** No palpable masses or nodules in either right or left breasts. No palpable axillary supraclavicular or infraclavicular adenopathy no breast tenderness or nipple discharge. (exam performed in the presence of a  chaperone)  LABORATORY DATA:  I have reviewed the data as listed     No data to display          No results found for: "WBC", "HGB", "HCT", "MCV", "PLT", "NEUTROABS"  ASSESSMENT & PLAN:  No problem-specific Assessment & Plan notes found for this encounter.    No orders of the defined types were placed in this encounter.  The patient has a good understanding of the overall plan. she agrees with it. she will call with any problems that may develop before the next visit here. Total time spent: 30 mins including face to face time and time spent for planning, charting and co-ordination of care   Suzzette Righter, [place] 06/23/22    I Gardiner Coins am scribing for Dr. Lindi Adie  ***

## 2022-06-26 ENCOUNTER — Inpatient Hospital Stay: Payer: Medicare HMO | Attending: Hematology and Oncology | Admitting: Hematology and Oncology

## 2022-06-26 ENCOUNTER — Inpatient Hospital Stay: Payer: Medicare HMO

## 2022-06-26 ENCOUNTER — Encounter: Payer: Self-pay | Admitting: *Deleted

## 2022-06-26 ENCOUNTER — Other Ambulatory Visit: Payer: Self-pay

## 2022-06-26 DIAGNOSIS — D0512 Intraductal carcinoma in situ of left breast: Secondary | ICD-10-CM

## 2022-06-26 DIAGNOSIS — Z170 Estrogen receptor positive status [ER+]: Secondary | ICD-10-CM | POA: Insufficient documentation

## 2022-06-26 DIAGNOSIS — Z006 Encounter for examination for normal comparison and control in clinical research program: Secondary | ICD-10-CM | POA: Diagnosis present

## 2022-06-26 DIAGNOSIS — Z79811 Long term (current) use of aromatase inhibitors: Secondary | ICD-10-CM | POA: Insufficient documentation

## 2022-06-26 LAB — RESEARCH LABS

## 2022-06-26 NOTE — Research (Signed)
AFT - 25: COMPARING AN OPERATION TO MONITORING, WITH OR WITHOUT ENDOCRINE THERAPY (COMET) FOR LOW RISK DCIS: A PHASE III PROSPECTIVE RANDOMIZED TRIAL   Patient arrived today at 9:15 am unaccompanied for her month 24 study visit.    PROs: Per study protocol, questionnaires are due at this time point.  The pt said that she received her paper questionnaires in the mail, and she will complete her questionnaires and mail them this week.  The pt was informed that they are to be completed by mid-October.  The pt verbalized understanding.    LABS: Per study protocol, research samples are due at this time point, and the pt's research samples were drawn.     MEDICATION REVIEW: Patient reviews and verifies the current medication list is correct.  The pt confirmed that she is taking her anastrozole daily.     VITAL SIGNS: Vital signs are collected per study protocol.   MD/PROVIDER VISIT: Please refer to MD's note from today's visit.    Mammograms: The study confirmed that all future mammograms need to be scheduled from the last mammogram. The pt's unilateral mammogram done on 01/27/22 was reviewed by Dr. Lindi Adie.  The pt's bilateral imaging is scheduled for 07/31/22.  The research nurse will call the pt with her results after Dr. Lindi Adie has read her mammogram report.     ADVERSE EVENTS: She states that she has intermittent mild breast pain (grade 1).  She denies any fatigue.  The pt reported the following AE's as ongoing: arthralgia, hot flashes, and vaginal dryness.  The pt states her arthralgia (joint stiffness) is moderate (grade 2), and she needs to resume her  tumeric which helped her in the past.  The pt said that her hot flashes are "mild" (grade 1).  Her vaginal dryness (grade 1) has remained the same since her last visit. The research nurse met with Dr. Lindi Adie about her AE's, and he provided attributions related to her anastrozole.  He signed/dated the Solicited AE form today.    ADVERSE EVENT LOGRobby Pirani [id number]   06/26/2022   Adverse Event Log   Study/Protocol: Comet/AFT-25 Cycle: month 24   Event Grade Onset Date Resolved Date Drug Name Attribution Treatment Comments  arthralgia Grade 2 Baseline  Ongoing Anastrozole  Possible  had injection left knee Pt uses tumeric and ginger with some relief  Hot flashes  Grade 1 Baseline Ongoing  Anastrozole Probable    pt reports less hot flashes  Vaginal  dryness Grade 1 12/01/20 Ongoing Anastrozole  Possible     Breast pain Grade 1  12/23/21 Ongoing  Anastrozole Unrelated  none   intermittent, not often per pt report  Grade 0 AE's:  fever, myalgia, hypertension, nausea, fracture, and allergic reaction.   Not evaluated Solicited AE's include:  Acute Coronary Syndrome, Ischemia Cerebrovascular, Osteoporosis (pt denies any recent Dexa scans), and Cholesterol (pt denies any recent lipid panels)   DISPOSITION: Upon completion off all study requirements, patient was escorted to the scheduler to have her future appointments scheduled for 01/01/23.   The patient was thanked for their time and continued voluntary participation in this study. Patient Norma Graves has been provided direct contact information and is encouraged to contact this Nurse for any needs or questions.  Brion Aliment RN, BSN, CCRP Clinical Research Nurse Lead 06/26/2022 10:58 AM

## 2022-06-26 NOTE — Assessment & Plan Note (Signed)
Screening mammogram detected less than 1 cm cluster of calcifications UIQ left breast. 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  Anastrozoletoxicities: 1. Hot flashes: getting better with ginger 2. Joint pains: turmeric is helping her joint stiffness and aches 3.Vaginal dryness:Same as before  Breast cancer surveillance: 1.Mammogram  left breast 01/27/2022:  No residual calcifications in the left breast at 7 o'clock position. 2.breast exam 12/23/2021: Benign, slight swelling of the left breast but no palpable lumps or nodules  Her husband had a couple of mini strokes and she has been worried about him.  81-month mammogram will be obtained for Comet clinical trial the next mammograms been set up for 07/31/2022.

## 2022-07-11 ENCOUNTER — Telehealth: Payer: Self-pay | Admitting: *Deleted

## 2022-07-11 NOTE — Telephone Encounter (Signed)
AFT - 25: COMPARING AN OPERATION TO MONITORING, WITH OR WITHOUT ENDOCRINE THERAPY (COMET) FOR LOW RISK DCIS: A PHASE III PROSPECTIVE RANDOMIZED TRIAL   Month 24 questionnaire follow up call   The research nurse checked to see if this pt had completed her month 24 questionnaire.  The PRO system has the questionnaire as not submitted.  The questionnaire is due by 07/28/22.  The research nurse called the pt to remind her to complete the paper questionnaire and to mail it by the deadline date of 07/28/22.  The pt said that she completed the questionnaire "2 days after her last office visit with Dr. Lindi Adie " (06/26/22 office visit - completed on 06/28/22).  The pt said that she gave the questionnaire to her daughter to mail at the post office in [facility].  The pt said that she would check with her daughter to ensure that her questionnaire was mailed.  The pt was reminded about her 07/31/22 mammogram appointment.  The pt thanked the nurse for the reminders.    Brion Aliment RN, BSN, CCRP Clinical Research Nurse Lead 07/11/2022 11:29 AM

## 2022-07-31 ENCOUNTER — Ambulatory Visit
Admission: RE | Admit: 2022-07-31 | Discharge: 2022-07-31 | Disposition: A | Discharge status: Home / Self Care | Payer: Medicare HMO | Source: Ambulatory Visit | Attending: Hematology and Oncology | Admitting: Hematology and Oncology

## 2022-07-31 ENCOUNTER — Other Ambulatory Visit: Payer: Self-pay | Admitting: Hematology and Oncology

## 2022-07-31 DIAGNOSIS — D0512 Intraductal carcinoma in situ of left breast: Secondary | ICD-10-CM

## 2022-12-26 NOTE — Progress Notes (Signed)
Patient Care Team: Rhea Bleacher, NP as PCP - Philomena Doheny, Paulette Blanch, RN as Oncology Nurse Navigator Rockwell Germany, RN as Oncology Nurse Navigator Nicholas Lose, MD as Medical Oncologist (Hematology and Oncology)  DIAGNOSIS:  Encounter Diagnosis  Name Primary?   Ductal carcinoma in situ (DCIS) of left breast Yes    SUMMARY OF ONCOLOGIC HISTORY: Oncology History  Ductal carcinoma in situ (DCIS) of left breast  02/13/2020 Initial Diagnosis   Screening mammogram detected less than 1 cm cluster of calcifications UIQ left breast.  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 The report date is 03/25/2020.  The Common Hereditary Cancers Panel offered by [facility] includes sequencing and/or deletion duplication testing of the following 48 genes: APC, ATM, AXIN2, BARD1, BMPR1A, BRCA1, BRCA2, BRIP1, CDH1, CDK4, CDKN2A (p14ARF), CDKN2A (p16INK4a), CHEK2, CTNNA1, DICER1, EPCAM (Deletion/duplication testing only), GREM1 (promoter region deletion/duplication testing only), KIT, MEN1, MLH1, MSH2, MSH3, MSH6, MUTYH, NBN, NF1, NHTL1, PALB2, PDGFRA, PMS2, POLD1, POLE, PTEN, RAD50, RAD51C, RAD51D, RNF43, SDHB, SDHC, SDHD, SMAD4, SMARCA4. STK11, TP53, TSC1, TSC2, and VHL.  The following genes were evaluated for sequence changes only: SDHA and HOXB13 c.251G>A variant only.     CHIEF COMPLIANT: left breast DCIS/ Comet trial   INTERVAL HISTORY: Norma Graves is a 72 y.o. with above-mentioned history of left breast DCIS currently in the COMET clinical trial, randomized to the active surveillance arm and currently on antiestrogen  therapy with anastrozole. She presents to the clinic for a follow-up. She reports pain joint stiffness is still about the same. She says hot flashes is mild they come and go. She does have some twinges in the breast. Denies any pain or discomfort in breast. Overall she is doing pretty good.    ALLERGIES:  is allergic to atorvastatin and tape.  MEDICATIONS:  Current Outpatient Medications  Medication Sig Dispense Refill   Cholecalciferol 1.25 MG (50000 UT) capsule TAKE 1 CAPSULE BY MOUTH ONCE WEEKLY FOR 12 WEEKS. REPEAT LABS WHEN DONE.     acetaminophen (TYLENOL) 325 MG tablet Take 325 mg by mouth every 6 (six) hours as needed.     anastrozole (ARIMIDEX) 1 MG tablet Take 1 tablet (1 mg total) by mouth daily. 90 tablet 3   aspirin 81 MG EC tablet Take 81 mg by mouth every evening.     azelastine (ASTELIN) 0.1 % nasal spray Place 1 spray into both nostrils 2 (two) times daily. Use in each nostril as directed 30 mL 12   calcium carbonate (OS-CAL) 1250 (500 Ca) MG chewable tablet Chew 200 mg by mouth 3 (three) times daily as needed for heartburn. 200 mg calcium (500 mg) chewable tablet     Cyanocobalamin (VITAMIN B-12 PO) Take 1,000 mcg by mouth daily.     ezetimibe (ZETIA) 10 MG tablet Take 10 mg by mouth daily.     FLUoxetine (PROZAC) 10 MG capsule Take 10 mg by mouth every other day.     levothyroxine (SYNTHROID) 50 MCG tablet Take 50 mcg by mouth daily. Daily at 0600     metoprolol succinate (TOPROL-XL) 25 MG 24 hr tablet Take  25 mg by mouth daily.     Multiple Vitamin (MULTIVITAMIN) capsule Take 1 capsule by mouth daily.     nitroGLYCERIN (NITROSTAT) 0.4 MG SL tablet Place 0.4 mg under the tongue every 5 (five) minutes as needed for chest pain.     promethazine-dextromethorphan (PROMETHAZINE-DM) 6.25-15 MG/5ML syrup Take 5 mLs by mouth 4 (four) times daily as needed for cough. 118 mL 0   REPATHA SURECLICK [id number] MG/ML SOAJ Inject 1 mL into the skin every 30 (thirty) days.     No current  facility-administered medications for this visit.    PHYSICAL EXAMINATION: ECOG PERFORMANCE STATUS: 1 - Symptomatic but completely ambulatory  Vitals:   01/01/23 1011  BP: (!) 134/56  Pulse: 78  Resp: 18  Temp: 98.1 F (36.7 C)  SpO2: 98%   Filed Weights   01/01/23 1011  Weight: 265 lb 8 oz (120.4 kg)      LABORATORY DATA:  I have reviewed the data as listed     No data to display          No results found for: "WBC", "HGB", "HCT", "MCV", "PLT", "NEUTROABS"  ASSESSMENT & PLAN:  Ductal carcinoma in situ (DCIS) of left breast 02/13/2020: Screening mammogram detected less than 1 cm cluster of calcifications UIQ left breast.  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   Anastrozole toxicities: 1. Hot flashes: Much improved 2. Joint pains: turmeric is helping her joint stiffness and aches.  Turmeric appears to be helping her significantly. 3.  Vaginal dryness: Same as before   I renewed her prescription for anastrozole today.  Breast cancer surveillance: 1.  Mammogram 07/31/2022: Stable biopsy site changes 10 o'clock position demonstrating DCIS.  Stable retroareolar left breast CSL.  Right breast benign.     Her husband had a couple of mini strokes but he is doing much better.  She has a 21 and half-year-old granddaughter who keeps her busy.   82-month mammogram will be obtained for Comet clinical trial    No orders of the defined types were placed in this encounter.  The patient has a good understanding of the overall plan. she agrees with it. she will call with any problems that may develop before the next visit here. Total time spent: 30 mins including face to face time and time spent for planning, charting and co-ordination of care   Harriette Ohara,  MD 01/01/23    I Gardiner Coins am acting as a Education administrator for [employer]  I have reviewed the above documentation for accuracy and completeness, and I agree with the above.

## 2022-12-28 ENCOUNTER — Encounter: Payer: Self-pay | Admitting: Gastroenterology

## 2023-01-01 ENCOUNTER — Inpatient Hospital Stay: Payer: Medicare HMO | Attending: Hematology and Oncology | Admitting: Hematology and Oncology

## 2023-01-01 ENCOUNTER — Other Ambulatory Visit: Payer: Self-pay

## 2023-01-01 ENCOUNTER — Encounter: Payer: Self-pay | Admitting: *Deleted

## 2023-01-01 VITALS — BP 134/56 | HR 78 | Temp 98.1°F | Resp 18 | Ht 63.5 in | Wt 265.5 lb

## 2023-01-01 DIAGNOSIS — Z79811 Long term (current) use of aromatase inhibitors: Secondary | ICD-10-CM | POA: Insufficient documentation

## 2023-01-01 DIAGNOSIS — D0512 Intraductal carcinoma in situ of left breast: Secondary | ICD-10-CM

## 2023-01-01 DIAGNOSIS — Z006 Encounter for examination for normal comparison and control in clinical research program: Secondary | ICD-10-CM

## 2023-01-01 MED ORDER — ANASTROZOLE 1 MG PO TABS: 1.0000 mg | ORAL_TABLET | Freq: Every day | ORAL | 3 refills | Status: DC

## 2023-01-01 NOTE — Assessment & Plan Note (Signed)
Screening mammogram detected less than 1 cm cluster of calcifications UIQ left breast.  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   Anastrozole toxicities: 1. Hot flashes: Much improved 2. Joint pains: turmeric is helping her joint stiffness and aches.  She ran out of turmeric and her joint stiffness has gotten slightly worse. 3.  Vaginal dryness: Same as before   Breast cancer surveillance: 1.  Mammogram 07/31/2022: Stable biopsy site changes 10 o'clock position demonstrating DCIS.  Stable retroareolar left breast CSL.  Right breast benign. 2. breast exam 01/01/2023: Benign, slight swelling of the left breast but no palpable lumps or nodules   Her husband had a couple of mini strokes and she has been worried about him.   31-month mammogram will be obtained for Comet clinical trial

## 2023-01-01 NOTE — Research (Unsigned)
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    PROs: Not required this visit.    LABS: None required this visit.    MEDICATION REVIEW: Patient reviews and verifies the current medication list is correct except for some cough syrup she is no longer taking and this was removed from her list.  The pt confirmed that she continues to take her anastrozole daily.     VITAL SIGNS: Vital signs are collected per study protocol.   MD/PROVIDER VISIT: Please refer to MD's note from today's visit.    Mammograms: The study confirmed that all future mammograms need to be scheduled from the last mammogram. The pt's bilateral mammogram completed on 07/31/22 was reviewed by Dr. Lindi Adie.  The next bilateral MMG is due after 08/01/23 and will be scheduled. Patient's next unilateral mammogram is scheduled for 01/30/23.  Patient is aware.    ADVERSE EVENTS: She states that she has intermittent mild breast pain (grade 1).  She denies any fatigue.  The pt reported the following AE's as ongoing: arthralgia, hot flashes, and vaginal dryness.  The pt states her arthralgia (joint stiffness) is moderate (grade 2) when she forgets to take her tumeric. The days she takes turmeric she has mild to no pain.  The pt said that her hot flashes continue to be "mild" (grade 1).  Her vaginal dryness (grade 1) has remained the same since her last visit. The research nurse met with Dr. Lindi Adie about her AE's, and he provided attributions related to her anastrozole.  He signed/dated the Solicited AE form today.    ADVERSE EVENT LOGSamyiah Graves [id number]   01/01/2023   Adverse Event Log   Study/Protocol: Comet/AFT-25 Cycle: month 30   Event Grade Onset Date Resolved Date Drug Name Attribution Treatment Comments  arthralgia Grade 2 Baseline  Ongoing  Anastrozole  Possible  had injection left knee Pt uses tumeric and ginger with some relief  Hot flashes  Grade 1 Baseline Ongoing  Anastrozole Probable    pt reports less hot flashes  Vaginal  dryness Grade 1 12/01/20 Ongoing Anastrozole  Possible     Breast pain Grade 1  12/23/21 Ongoing  Anastrozole Unrelated  none   intermittent, not often per pt report  Grade 0 AE's:  fever, myalgia, hypertension, nausea, fracture, and allergic reaction.   Not evaluated Solicited AE's include:  Acute Coronary Syndrome, Ischemia Cerebrovascular, Osteoporosis (pt denies any recent Dexa scans), and Cholesterol (pt denies any recent lipid panels)   DISPOSITION: Upon completion off all study requirements, patient was escorted to the lobby. She said the scheduler could call her with future appointment to be scheduled the week of 06/25/23-07/02/23.   The patient was thanked for their time and continued voluntary participation in this study. Patient Norma Graves has been provided direct contact information and is encouraged to contact Doristine Johns, RN for any needs or questions.  Foye Spurling, BSN, RN, Sp[facility] Nurse II [phone] 01/01/2023 11:00 AM

## 2023-01-08 ENCOUNTER — Telehealth: Payer: Self-pay | Admitting: Hematology and Oncology

## 2023-01-08 ENCOUNTER — Other Ambulatory Visit: Payer: Self-pay | Admitting: *Deleted

## 2023-01-08 DIAGNOSIS — D0512 Intraductal carcinoma in situ of left breast: Secondary | ICD-10-CM

## 2023-01-08 NOTE — Telephone Encounter (Signed)
Scheduled appointment per los. Patient is aware of the made appointments. 

## 2023-02-02 ENCOUNTER — Other Ambulatory Visit: Payer: Self-pay | Admitting: Hematology and Oncology

## 2023-02-02 ENCOUNTER — Ambulatory Visit
Admission: RE | Admit: 2023-02-02 | Discharge: 2023-02-02 | Disposition: A | Discharge status: Home / Self Care | Payer: Medicare HMO | Source: Ambulatory Visit | Attending: Hematology and Oncology | Admitting: Hematology and Oncology

## 2023-02-02 DIAGNOSIS — D0512 Intraductal carcinoma in situ of left breast: Secondary | ICD-10-CM

## 2023-03-07 ENCOUNTER — Ambulatory Visit: Payer: Medicare HMO | Admitting: Gastroenterology

## 2023-03-07 ENCOUNTER — Encounter: Payer: Self-pay | Admitting: Gastroenterology

## 2023-03-07 VITALS — BP 128/70 | HR 69 | Ht 64.0 in | Wt 267.1 lb

## 2023-03-07 DIAGNOSIS — Z860100 Personal history of colon polyps, unspecified: Secondary | ICD-10-CM

## 2023-03-07 DIAGNOSIS — Z800 Family history of malignant neoplasm of digestive organs: Secondary | ICD-10-CM | POA: Diagnosis not present

## 2023-03-07 DIAGNOSIS — K5902 Outlet dysfunction constipation: Secondary | ICD-10-CM

## 2023-03-07 MED ORDER — NA SULFATE-K SULFATE-MG SULF 17.5-3.13-1.6 GM/177ML PO SOLN: 1.0000 | Freq: Once | ORAL | 0 refills | Status: AC

## 2023-03-07 NOTE — Progress Notes (Addendum)
HPI : Norma Graves is a very pleasant 72 year old female with a history of coronary artery disease and DCIS who is referred to [place] by Drusilla Kanner, NP for consideration for colonoscopy.  The patient states her last colonoscopy was in 2018 and she had three precancerous polyps removed and was recommended to repeat in 3 years.  She recalls having a colonoscopy many years ago prior to the one 2018, and that one was normal.   She thinks her mother was diagnosed with rectal cancer in her 61s, and that her sister had colon polyps.   She denies any chronic GI symptoms, but does sometimes have bouts of constipation.  She has had episodes in which she has gone a few days without a bowel movement, and she will feel a bulge in the perineum when having a bowel movement.  She finds that applying manual pressure to this bulge helps evacuate stool.  Most of the time, she has no difficulty with evacuating stool.  No problems with straining. No overt GI bleeding.  No problems with abdominal pain.  She will have loose stools if she eats a lot of fruit or juice, but otherwise no issues with diarrhea.  Her weight has been stable.  She has a history of an MI in 2014, and underwent went cardiac catheterization with a DES to the LAD and another stent placement in the LAD in 2021.  She reported having some mild stable exertional chest discomfort at her last cardiology visit in March.  She reportedly had a negative stress test recently.  She has an initial appointment with her new cardiologist, Dr. Judithe Modest, in 2 weeks.  She tells me she has been having more frequent chest pains recently and wants to discuss this with him.  She takes aspirin and prasugrel. She denies light-headedness/dizziness, orthopnea, LE swelling.  She has chronic, infrequent palpitations.  She gets short of breath with exertion, but otherwise denies dyspnea. 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   Past  Medical History:  Diagnosis Date   Arthritis    Breast cancer (HCC)    CAD (coronary artery disease)    Elevated cholesterol    Family history of breast cancer    Family history of lung cancer    Family history of rectal cancer    Obesity    Thyroid disease      Past Surgical History:  Procedure Laterality Date   COLONOSCOPY     Dr Judie Petit in [place]   CORONARY STENT PLACEMENT     x2 2014 and 09-21-2021 in [facility]   Family History  Problem Relation Age of Onset   Rectal cancer Mother        dx. in her late 43s   Lung cancer Father 48       smoker   Breast cancer Sister        dx. in her 55s   Breast cancer Maternal Aunt 50     Current Outpatient Medications  Medication Sig Dispense Refill   acetaminophen (TYLENOL) 325 MG tablet Take 325 mg by mouth every 6 (six) hours as needed.     anastrozole (ARIMIDEX) 1 MG tablet Take 1 tablet (1 mg total) by mouth daily. 90 tablet 3   aspirin 81 MG EC tablet Take 81 mg by mouth every evening.     azelastine (ASTELIN) 0.1 % nasal spray Place 1 spray into both nostrils 2 (two) times daily. Use  in each nostril as directed 30 mL 12   calcium carbonate (OS-CAL) 1250 (500 Ca) MG chewable tablet Chew 200 mg by mouth 3 (three) times daily as needed for heartburn. 200 mg calcium (500 mg) chewable tablet     Cholecalciferol 1.25 MG (50000 UT) capsule TAKE 1 CAPSULE BY MOUTH ONCE WEEKLY FOR 12 WEEKS. REPEAT LABS WHEN DONE.     Cyanocobalamin (VITAMIN B-12 PO) Take 1,000 mcg by mouth daily.     FLUoxetine (PROZAC) 10 MG capsule Take 10 mg by mouth every other day.     levothyroxine (SYNTHROID) 50 MCG tablet Take 50 mcg by mouth daily. Daily at 0600     metoprolol succinate (TOPROL-XL) 25 MG 24 hr tablet Take 25 mg by mouth daily.     Multiple Vitamin (MULTIVITAMIN) capsule Take 1 capsule by mouth daily.     nitroGLYCERIN (NITROSTAT) 0.4 MG SL tablet Place 0.4 mg under the tongue every 5 (five) minutes as needed for chest pain.     REPATHA  SURECLICK 140 MG/ML SOAJ Inject 1 mL into the skin every 30 (thirty) days.     No current facility-administered medications for this visit.   Allergies  Allergen Reactions   Atorvastatin Other (See Comments)    Muscle pain and hurt all over per patient Muscle pain and hurt all over per patient    Tape Dermatitis and Itching     Review of Systems: All systems reviewed and negative except where noted in HPI.    No results found.  Physical Exam: BP 128/70   Pulse 69   Ht 5\' 4"  (1.626 m)   Wt 267 lb 2 oz (121.2 kg)   SpO2 (!) 70%   BMI 45.85 kg/m  Constitutional: Pleasant,well-developed, Caucasian female in no acute distress. HEENT: Normocephalic and atraumatic. Conjunctivae are normal. No scleral icterus. Neck supple.  Cardiovascular: Normal rate, regular rhythm.  Pulmonary/chest: Effort normal and breath sounds normal. No wheezing, rales or rhonchi. Abdominal: Soft, nondistended, nontender. Bowel sounds active throughout. There are no masses palpable. No hepatomegaly. Extremities: no edema Lymphadenopathy: No cervical adenopathy noted. Neurological: Alert and oriented to person place and time. Skin: Skin is warm and dry. No rashes noted. Psychiatric: Normal mood and affect. Behavior is normal.  CBC No results found for: "WBC", "RBC", "HGB", "HCT", "PLT", "MCV", "MCH", "MCHC", "RDW", "LYMPHSABS", "MONOABS", "EOSABS", "BASOSABS"  CMP  No results found for: "NA", "K", "CL", "CO2", "GLUCOSE", "BUN", "CREATININE", "CALCIUM", "PROT", "ALBUMIN", "AST", "ALT", "ALKPHOS", "BILITOT", "GFRNONAA", "GFRAA"      No data to display            ASSESSMENT AND PLAN:  72 year old female with a history of colon polyps and family history of rectal cancer (mother, 64s), overdue for surveillance colonoscopy.  We do not have the records from the patient's previous colonoscopy, but the patient is a very good medical historian and so would be reasonable to assume she is due.  We will  request the colonoscopy/pathology reports from her previous gastroenterologist.  She has a significant history of CAD, undergoing stent placement to the LAD x 2 (2014, 2021) and is on DAPT.  She has been having more frequent exertional chest discomfort which is similar to her index symptoms.  She reportedly had a normal stress test recently (I am unable to see this), but has an appointment with her new cardiologist in 2 weeks. Although she needs a colonoscopy, she may have some progression of her CAD that warrants further evaluation. We will  tentatively schedule her for a colonoscopy, but will request cardiology clearance to proceed with an elective procedure and to hold prasugrel for 5 days prior to her procedure.  If further testing is recommended by her cardiologist, we will not proceed with her colonoscopy until all evaluation is complete and her cardiologist feels she is a reasonable risk for a colonoscopy. Her symptoms of a perianal bulge with constipation seem consistent with a rectocele.  Given the chronic but infrequent occurrence of symptoms, I don't recommend any further evaluation at this time.  Will examine rectum/perineum at time of colonoscopy.   History of colon polyps/family history of colon cancer. - Colonoscopy - Cardiology clearance, hold prasugrel  Occasional constipation/manual splinting - Likely small rectocele  The details, risks (including bleeding, perforation, infection, missed lesions, medication reactions and possible hospitalization or surgery if complications occur), benefits, and alternatives to colonoscopy with possible biopsy and possible polypectomy were discussed with the patient and she consents to proceed.   Norma Graves E. Tomasa Rand, MD [facility]   Erskine Emery, NP  ADDENDUM: Received patient's colonoscopy from [facility] 01/16/14  Dr. Jennye Boroughs Indication: History of colon polyps 3 diminutive right colon polyps removed  with forceps Small external hemorrhoids  Pathology Report Tubular adenoma (3 fragments) Benign colorectal mucosa, remaining fragments Associated benign lymphoid aggregates

## 2023-03-07 NOTE — Patient Instructions (Addendum)
_______________________________________________________  If your blood pressure at your visit was 140/90 or greater, please contact your primary care physician to follow up on this.  _______________________________________________________  If you are age 72 or older, your body mass index should be between 23-30. Your Body mass index is 45.85 kg/m. If this is out of the aforementioned range listed, please consider follow up with your Primary Care Provider.  If you are age 42 or younger, your body mass index should be between 19-25. Your Body mass index is 45.85 kg/m. If this is out of the aformentioned range listed, please consider follow up with your Primary Care Provider.   ________________________________________________________  The [facility] providers would like to encourage you to use [facility] to communicate with providers for non-urgent requests or questions.  Due to long hold times on the telephone, sending your provider a message by [facility] may be a faster and more efficient way to get a response.  Please allow 48 business hours for a response.  Please remember that this is for non-urgent requests.  _______________________________________________________   We have sent the following medications to your pharmacy for you to pick up at your convenience: Suprep  You have been scheduled for a colonoscopy. Please follow written instructions given to you at your visit today.  Please pick up your prep supplies at the pharmacy within the next 1-3 days. If you use inhalers (even only as needed), please bring them with you on the day of your procedure.  Please make sure your cardiologist respond to our clearance.  You will be contacted by our office prior to your procedure for directions on holding your prasugrel (EFFIENT).  If you do not hear from our office 2 week prior to your scheduled procedure, please call [phone] to discuss.   It was a pleasure to see you today!  Thank you for  trusting me with your gastrointestinal care!

## 2023-03-14 ENCOUNTER — Telehealth: Payer: Self-pay

## 2023-03-14 NOTE — Telephone Encounter (Signed)
Dr Judithe Modest said patient can hold Effient 5 days prior to her procedure and she is at low cardiac risk  for the above planned procedure.  Patient made aware and voiced understanding.

## 2023-03-21 IMAGING — MG MM BREAST BX W LOC DEV 1ST LESION IMAGE BX SPEC STEREO GUIDE*L*
8 of 14 series · 8 of 22 positions shown · non-contrast
Comparison: Previous exams.
COMPARISON: Previous exams.

Addendum:
CLINICAL DATA: Patient presents for stereotactic core needle biopsy
of left breast calcifications.

EXAM:
LEFT BREAST STEREOTACTIC CORE NEEDLE BIOPSY

[L (1 of 8)]
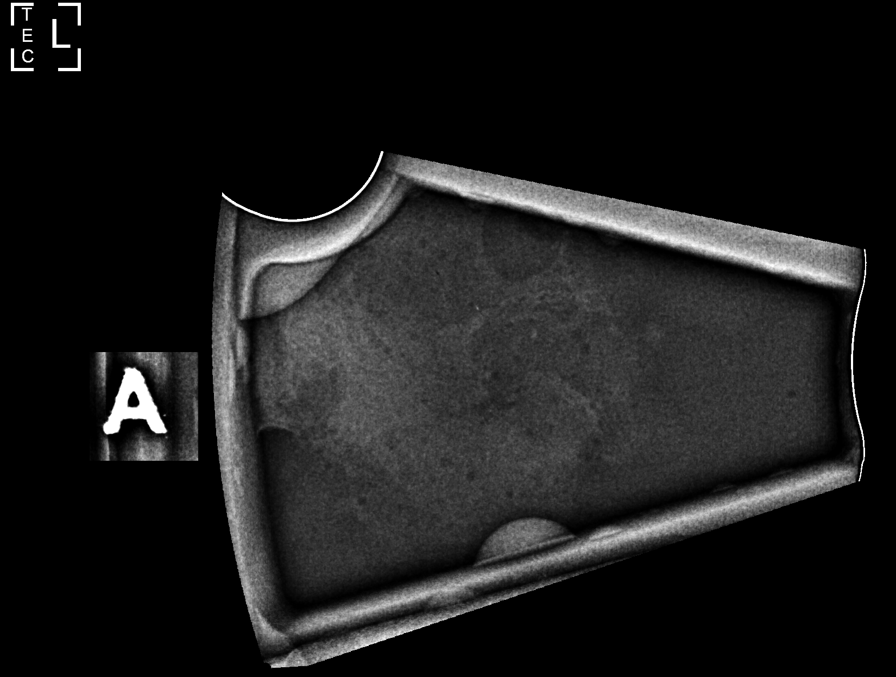

[L (2 of 8)]
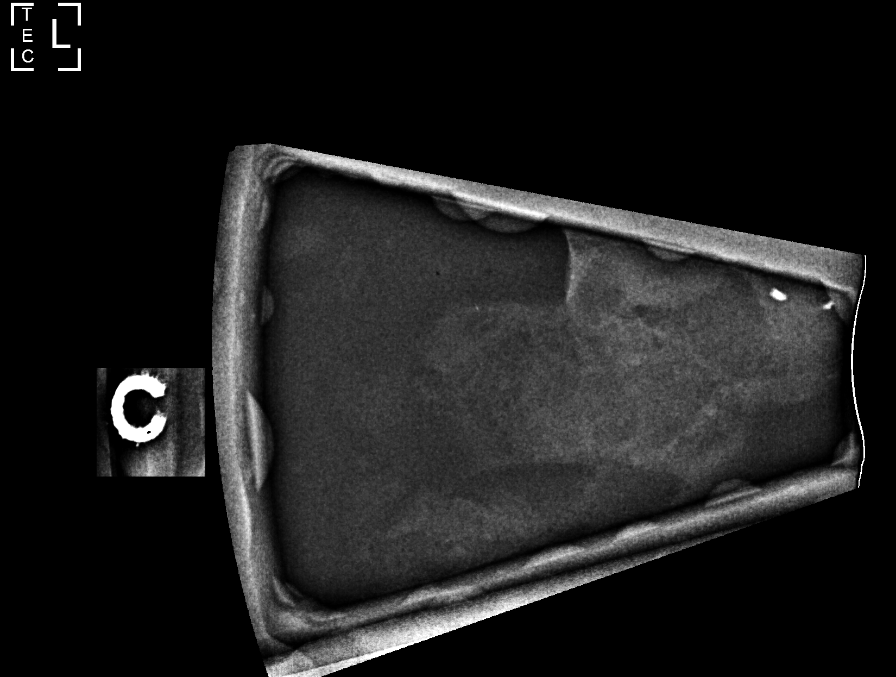

[L (3 of 8)]
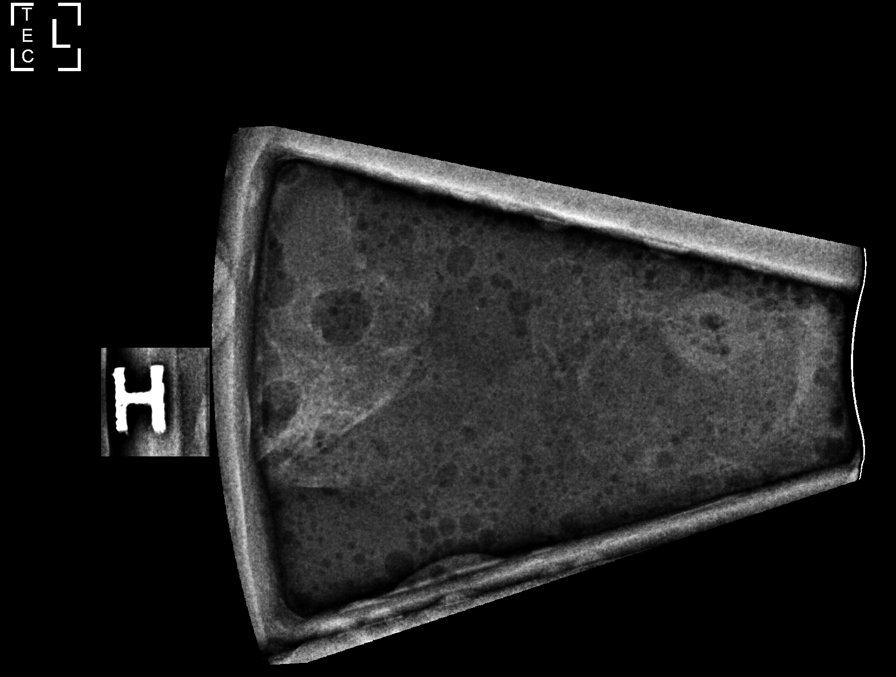

[L (4 of 8)]
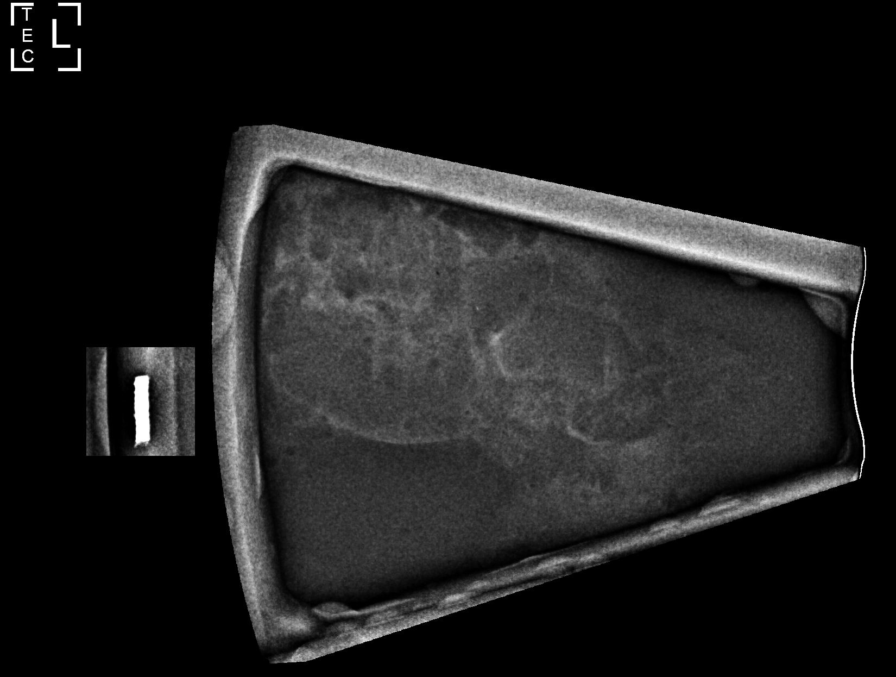

[L (5 of 8)]
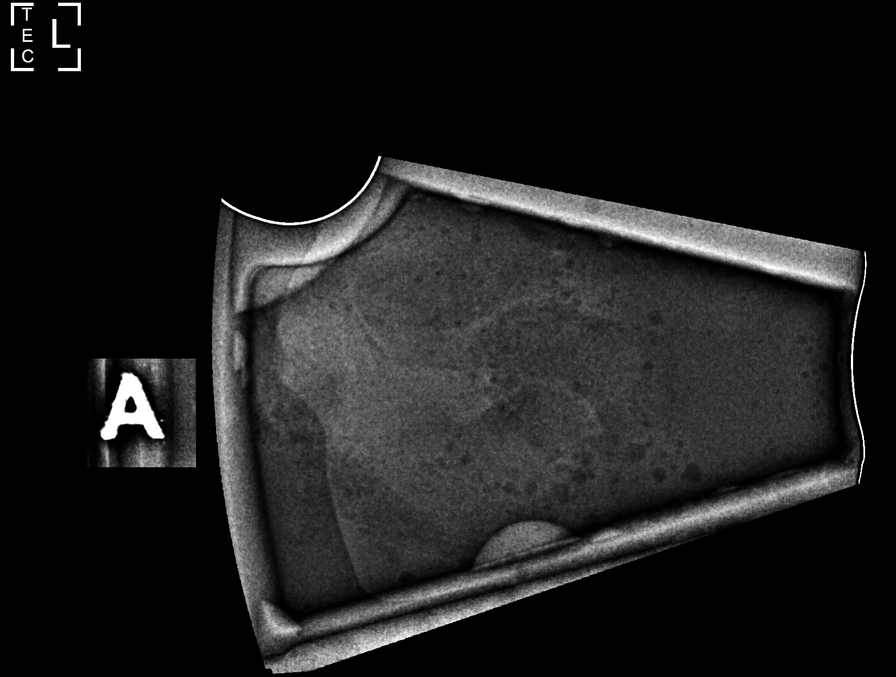

[L (6 of 8)]
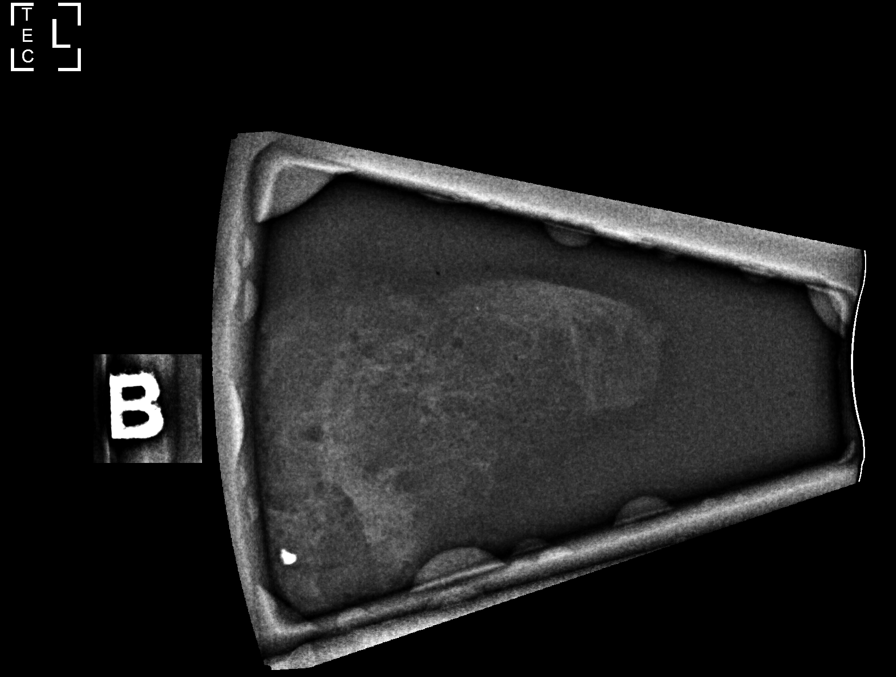

[L (7 of 8)]
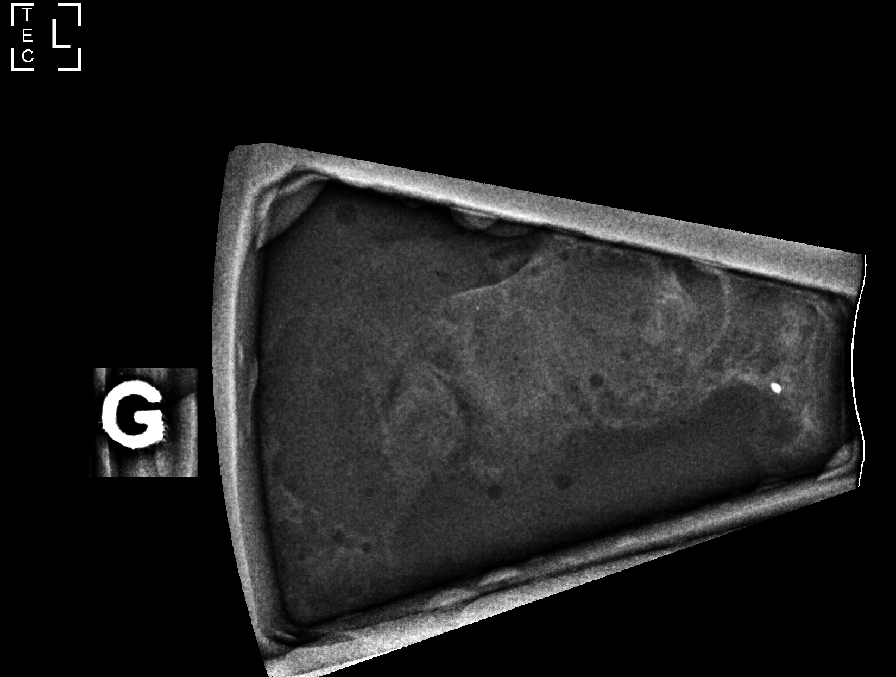

[L (8 of 8)]
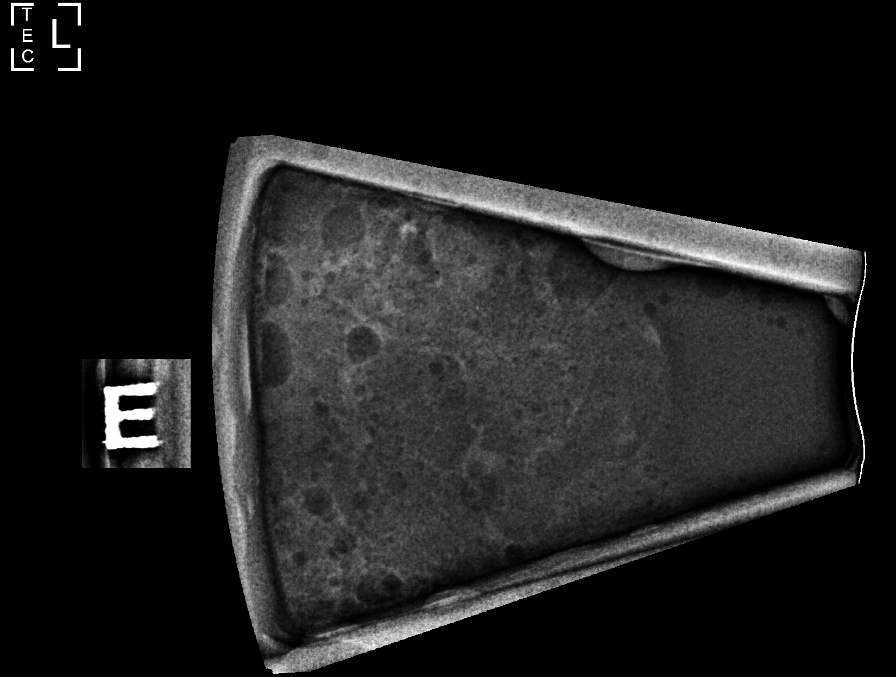

[8 of 22 positions shown; findings below may reference images not displayed]



Using sterile technique and 1% Lidocaine as local anesthetic, under
stereotactic guidance, a 9 gauge vacuum assisted device was used to
perform core needle biopsy of calcifications in the lower outer
quadrant of the left breast using a lateral approach. Specimen
radiograph was performed showing calcifications for which biopsy was
performed. Specimens with calcifications are identified for
pathology.

Lesion quadrant: Lower outer quadrant

At the conclusion of the procedure, an X shaped tissue marker clip
was deployed into the biopsy cavity. Follow-up 2-view mammogram was
performed and dictated separately.
IMPRESSION: Stereotactic-guided biopsy of left breast calcifications. No
apparent complications.

ADDENDUM:
Pathology revealed COMPLEX SCLEROSING LESION WITH USUAL DUCTAL
HYPERPLASIA AND CALCIFICATIONS, PSEUDOANGIOMATOUS STROMAL
HYPERPLASIA (PASH) of the LEFT breast, lower outer quadrant. This
was found to be concordant by Dr. Niemiec Olas, with surgical
consultation for consideration of excision recommended.

Pathology results were discussed with the patient by telephone. The
patient reported doing well after the biopsy with tenderness at the
site. Post biopsy instructions and care were reviewed and questions
were answered. The patient was encouraged to call The [REDACTED]

Per patient request, surgical consultation has been arranged with

Pathology results reported by Raster Fo RN on 12/28/2020.



Using sterile technique and 1% Lidocaine as local anesthetic, under
stereotactic guidance, a 9 gauge vacuum assisted device was used to
perform core needle biopsy of calcifications in the lower outer
quadrant of the left breast using a lateral approach. Specimen
radiograph was performed showing calcifications for which biopsy was
performed. Specimens with calcifications are identified for
pathology.

Lesion quadrant: Lower outer quadrant

At the conclusion of the procedure, an X shaped tissue marker clip
was deployed into the biopsy cavity. Follow-up 2-view mammogram was
performed and dictated separately.
IMPRESSION: Stereotactic-guided biopsy of left breast calcifications. No
apparent complications.

## 2023-04-17 ENCOUNTER — Encounter: Payer: Self-pay | Admitting: Gastroenterology

## 2023-04-17 ENCOUNTER — Ambulatory Visit (AMBULATORY_SURGERY_CENTER): Payer: Medicare HMO | Admitting: Gastroenterology

## 2023-04-17 VITALS — BP 130/70 | HR 65 | Temp 96.6°F | Resp 12 | Ht 64.0 in | Wt 267.0 lb

## 2023-04-17 DIAGNOSIS — Z86010 Personal history of colonic polyps: Secondary | ICD-10-CM | POA: Diagnosis not present

## 2023-04-17 DIAGNOSIS — D122 Benign neoplasm of ascending colon: Secondary | ICD-10-CM

## 2023-04-17 DIAGNOSIS — D124 Benign neoplasm of descending colon: Secondary | ICD-10-CM

## 2023-04-17 DIAGNOSIS — D120 Benign neoplasm of cecum: Secondary | ICD-10-CM

## 2023-04-17 DIAGNOSIS — D123 Benign neoplasm of transverse colon: Secondary | ICD-10-CM

## 2023-04-17 DIAGNOSIS — Z09 Encounter for follow-up examination after completed treatment for conditions other than malignant neoplasm: Secondary | ICD-10-CM

## 2023-04-17 MED ORDER — SODIUM CHLORIDE 0.9 % IV SOLN: 500.0000 mL | Freq: Once | INTRAVENOUS | Status: DC

## 2023-04-17 NOTE — Progress Notes (Signed)
 Pt's states no medical or surgical changes since previsit or office visit. 

## 2023-04-17 NOTE — Op Note (Addendum)
[facility] Patient Name: Norma Graves Procedure Date: 04/17/2023 1:37 PM MRN: [id number] Endoscopist: Lorin Picket E. Tomasa Rand , MD, [id number] Age: 72 Referring MD:  Date of Birth: [birth year] Gender: Female Account #: [id number] Procedure:                Colonoscopy Indications:              Surveillance: Personal history of adenomatous                            polyps on last colonoscopy > 5 years ago Medicines:                Monitored Anesthesia Care Procedure:                Pre-Anesthesia Assessment:                           - Prior to the procedure, a History and Physical                            was performed, and patient medications and                            allergies were reviewed. The patient's tolerance of                            previous anesthesia was also reviewed. The risks                            and benefits of the procedure and the sedation                            options and risks were discussed with the patient.                            All questions were answered, and informed consent                            was obtained. Prior Anticoagulants: The patient has                            taken Effient (prasugrel), last dose was 7 days                            prior to procedure. ASA Grade Assessment: III - A                            patient with severe systemic disease. After                            reviewing the risks and benefits, the patient was                            deemed in satisfactory condition to undergo the  procedure.                           After obtaining informed consent, the colonoscope                            was passed under direct vision. Throughout the                            procedure, the patient's blood pressure, pulse, and                            oxygen saturations were monitored continuously. The                            CF [id number] #[id number] was introduced  through the anus                            and advanced to the the terminal ileum, with                            identification of the appendiceal orifice and IC                            valve. The colonoscopy was performed without                            difficulty. The patient tolerated the procedure                            well. The quality of the bowel preparation was                            excellent. The terminal ileum, ileocecal valve,                            appendiceal orifice, and rectum were photographed.                            The bowel preparation used was SUPREP via split                            dose instruction. Scope In: 1:56:38 PM Scope Out: 2:25:10 PM Scope Withdrawal Time: 0 hours 25 minutes 9 seconds  Total Procedure Duration: 0 hours 28 minutes 32 seconds  Findings:                 The perianal and digital rectal examinations were                            normal. Pertinent negatives include normal                            sphincter tone and no palpable rectal lesions.  Two sessile polyps were found in the cecum. The                            polyps were 2 to 3 mm in size. These polyps were                            removed with a cold snare. Resection and retrieval                            were complete. Estimated blood loss was minimal.                           Three sessile polyps were found in the ascending                            colon. The polyps were 2 to 5 mm in size. These                            polyps were removed with a cold snare. Resection                            was complete, but the polyp tissue was only                            partially retrieved (one of the three polyps was                            not retrieved). Estimated blood loss was minimal.                           A 5 mm polyp was found in the hepatic flexure. The                            polyp was sessile. The polyp  was removed with a                            cold snare. Resection and retrieval were complete.                            Estimated blood loss was minimal.                           Two sessile polyps were found in the transverse                            colon. The polyps were 2 to 3 mm in size. These                            polyps were removed with a cold snare. Resection  and retrieval were complete. Estimated blood loss                            was minimal.                           Two sessile polyps were found in the descending                            colon. The polyps were 1 to 6 mm in size. These                            polyps were removed with a cold snare. Resection                            and retrieval were complete. Estimated blood loss                            was minimal.                           A few small-mouthed diverticula were found in the                            sigmoid colon.                           The exam was otherwise normal throughout the                            examined colon.                           The terminal ileum appeared normal.                           The retroflexed view of the distal rectum and anal                            verge was normal and showed no anal or rectal                            abnormalities. Complications:            No immediate complications. Estimated Blood Loss:     Estimated blood loss was minimal. Impression:               - Two 2 to 3 mm polyps in the cecum, removed with a                            cold snare. Resected and retrieved.                           - Three 2 to 5 mm polyps in the ascending colon,  removed with a cold snare. Complete resection.                            Partial retrieval (one polyp not retrieved).                           - One 5 mm polyp at the hepatic flexure, removed                            with a cold  snare. Resected and retrieved.                           - Two 2 to 3 mm polyps in the transverse colon,                            removed with a cold snare. Resected and retrieved.                           - Two 1 to 6 mm polyps in the descending colon,                            removed with a cold snare. Resected and retrieved.                           - Diverticulosis in the sigmoid colon.                           - The examined portion of the ileum was normal.                           - The distal rectum and anal verge are normal on                            retroflexion view. Recommendation:           - Patient has a contact number available for                            emergencies. The signs and symptoms of potential                            delayed complications were discussed with the                            patient. Return to normal activities tomorrow.                            Written discharge instructions were provided to the                            patient.                           - Resume previous diet.                           -  Continue present medications.                           - Await pathology results.                           - Repeat colonoscopy (date not yet determined) for                            surveillance based on pathology results.                           - Resume Effient (prasugrel) at prior dose tomorrow. Sherline Eberwein E. Tomasa Rand, MD 04/17/2023 2:38:06 PM This report has been signed electronically.

## 2023-04-17 NOTE — Progress Notes (Signed)
 Called to room to assist during endoscopic procedure.  Patient ID and intended procedure confirmed with present staff. Received instructions for my participation in the procedure from the performing physician.  

## 2023-04-17 NOTE — Patient Instructions (Addendum)
Resume previous diet Continue present medications RESUME EFFIENT AT PRIOR DOSE TOMORROW, WEDNESDAY, JULY 17TH. Await pathology results Handouts/information given for polyps and diverticulosis   YOU HAD AN ENDOSCOPIC PROCEDURE TODAY AT THE [facility]:   Refer to the procedure report that was given to you for any specific questions about what was found during the examination.  If the procedure report does not answer your questions, please call your gastroenterologist to clarify.  If you requested that your care partner not be given the details of your procedure findings, then the procedure report has been included in a sealed envelope for you to review at your convenience later.  YOU SHOULD EXPECT: Some feelings of bloating in the abdomen. Passage of more gas than usual.  Walking can help get rid of the air that was put into your GI tract during the procedure and reduce the bloating. If you had a lower endoscopy (such as a colonoscopy or flexible sigmoidoscopy) you may notice spotting of blood in your stool or on the toilet paper. If you underwent a bowel prep for your procedure, you may not have a normal bowel movement for a few days.  Please Note:  You might notice some irritation and congestion in your nose or some drainage.  This is from the oxygen used during your procedure.  There is no need for concern and it should clear up in a day or so.  SYMPTOMS TO REPORT IMMEDIATELY:  Following lower endoscopy (colonoscopy):  Excessive amounts of blood in the stool  Significant tenderness or worsening of abdominal pains  Swelling of the abdomen that is new, acute  Fever of 100F or higher  For urgent or emergent issues, a gastroenterologist can be reached at any hour by calling [phone]. Do not use MyChart messaging for urgent concerns.    DIET:  We do recommend a small meal at first, but then you may proceed to your regular diet.  Drink plenty of fluids but you should avoid  alcoholic beverages for 24 hours.  ACTIVITY:  You should plan to take it easy for the rest of today and you should NOT DRIVE or use heavy machinery until tomorrow (because of the sedation medicines used during the test).    FOLLOW UP: Our staff will call the number listed on your records the next business day following your procedure.  We will call around 7:15- 8:00 am to check on you and address any questions or concerns that you may have regarding the information given to you following your procedure. If we do not reach you, we will leave a message.     If any biopsies were taken you will be contacted by phone or by letter within the next 1-3 weeks.  Please call us at [phone] if you have not heard about the biopsies in 3 weeks.    SIGNATURES/CONFIDENTIALITY: You and/or your care partner have signed paperwork which will be entered into your electronic medical record.  These signatures attest to the fact that that the information above on your After Visit Summary has been reviewed and is understood.  Full responsibility of the confidentiality of this discharge information lies with you and/or your care-partner.

## 2023-04-17 NOTE — Progress Notes (Signed)
[facility] History and Physical   Primary Care Physician:  Erskine Emery, NP   Reason for Procedure:   Colon cancer screening/polyp surveillance  Plan:    Colonoscopy     HPI: Norma Graves is a 72 y.o. female  screening colonoscopy/polyp surveillance.  He has no family history of colon cancer and no chronic GI symptoms. She thinks her last colonoscopy was in 2018 with three polyps removed.  She thinks her mother was diagnosed with rectal cancer in her 75s. She takes prasugrel for CAD/stent placement and says her last dose was 7/9 (was instructed to hold for 5 days).   Past Medical History:  Diagnosis Date   Arthritis    Breast cancer (HCC)    CAD (coronary artery disease)    Elevated cholesterol    Family history of breast cancer    Family history of lung cancer    Family history of rectal cancer    Obesity    Thyroid disease     Past Surgical History:  Procedure Laterality Date   COLONOSCOPY  01/16/2014   Dr Effie Berkshire. Diminutive colon polyp (3) Small external hemorrhoids   CORONARY STENT PLACEMENT     x2 2014 and 09-21-2021 in [facility]    Prior to Admission medications   Medication Sig Start Date End Date Taking? Authorizing Provider  acetaminophen (TYLENOL) 325 MG tablet Take 325 mg by mouth every 6 (six) hours as needed. 01/09/20  Yes [provider]  anastrozole (ARIMIDEX) 1 MG tablet Take 1 tablet (1 mg total) by mouth daily. 01/01/23  Yes Serena Croissant, MD  calcium carbonate (OS-CAL) 1250 (500 Ca) MG chewable tablet Chew 200 mg by mouth 3 (three) times daily as needed for heartburn. 200 mg calcium (500 mg) chewable tablet   Yes [provider]  Cholecalciferol 1.25 MG (50000 UT) capsule TAKE 1 CAPSULE BY MOUTH ONCE WEEKLY FOR 12 WEEKS. REPEAT LABS WHEN DONE. 05/07/20  Yes [provider]  Cyanocobalamin (VITAMIN B-12 PO) Take 1,000 mcg by mouth daily.   Yes [provider]  FLUoxetine (PROZAC) 10 MG capsule  Take 10 mg by mouth every other day. 06/04/18  Yes [provider]  levothyroxine (SYNTHROID) 50 MCG tablet Take 50 mcg by mouth daily. Daily at 0600 06/07/18  Yes [provider]  metoprolol succinate (TOPROL-XL) 25 MG 24 hr tablet Take 25 mg by mouth daily. 03/28/17  Yes [provider]  Multiple Vitamin (MULTIVITAMIN) capsule Take 1 capsule by mouth daily.   Yes [provider]  aspirin 81 MG EC tablet Take 81 mg by mouth every evening.    [provider]  azelastine (ASTELIN) 0.1 % nasal spray Place 1 spray into both nostrils 2 (two) times daily. Use in each nostril as directed 12/23/21   Serena Croissant, MD  Evolocumab (REPATHA SURECLICK) 140 MG/ML SOAJ Inject into the skin. 03/21/23   [provider]  nitroGLYCERIN (NITROSTAT) 0.4 MG SL tablet Place 0.4 mg under the tongue every 5 (five) minutes as needed for chest pain.    [provider]  prasugrel (EFFIENT) 10 MG TABS tablet Take 10 mg by mouth daily.    [provider]  REPATHA SURECLICK 140 MG/ML SOAJ Inject 1 mL into the skin every 30 (thirty) days.    [provider]    Current Outpatient Medications  Medication Sig Dispense Refill   acetaminophen (TYLENOL) 325 MG tablet Take 325 mg by mouth every 6 (six) hours as needed.  anastrozole (ARIMIDEX) 1 MG tablet Take 1 tablet (1 mg total) by mouth daily. 90 tablet 3   calcium carbonate (OS-CAL) 1250 (500 Ca) MG chewable tablet Chew 200 mg by mouth 3 (three) times daily as needed for heartburn. 200 mg calcium (500 mg) chewable tablet     Cholecalciferol 1.25 MG (50000 UT) capsule TAKE 1 CAPSULE BY MOUTH ONCE WEEKLY FOR 12 WEEKS. REPEAT LABS WHEN DONE.     Cyanocobalamin (VITAMIN B-12 PO) Take 1,000 mcg by mouth daily.     FLUoxetine (PROZAC) 10 MG capsule Take 10 mg by mouth every other day.     levothyroxine (SYNTHROID) 50 MCG tablet Take 50 mcg by mouth daily. Daily at 0600     metoprolol succinate (TOPROL-XL) 25  MG 24 hr tablet Take 25 mg by mouth daily.     Multiple Vitamin (MULTIVITAMIN) capsule Take 1 capsule by mouth daily.     aspirin 81 MG EC tablet Take 81 mg by mouth every evening.     azelastine (ASTELIN) 0.1 % nasal spray Place 1 spray into both nostrils 2 (two) times daily. Use in each nostril as directed 30 mL 12   Evolocumab (REPATHA SURECLICK) 140 MG/ML SOAJ Inject into the skin.     nitroGLYCERIN (NITROSTAT) 0.4 MG SL tablet Place 0.4 mg under the tongue every 5 (five) minutes as needed for chest pain.     prasugrel (EFFIENT) 10 MG TABS tablet Take 10 mg by mouth daily.     REPATHA SURECLICK 140 MG/ML SOAJ Inject 1 mL into the skin every 30 (thirty) days.     Current Facility-Administered Medications  Medication Dose Route Frequency Provider Last Rate Last Admin   0.9 %  sodium chloride infusion  500 mL Intravenous Once Jenel Lucks, MD        Allergies as of 04/17/2023 - Review Complete 04/17/2023  Allergen Reaction Noted   Atorvastatin Other (See Comments) 03/26/2017   Tape Dermatitis and Itching 01/06/2020    Family History  Problem Relation Age of Onset   Rectal cancer Mother        dx. in her late 30s   Lung cancer Father 40       smoker   Breast cancer Sister        dx. in her 69s   Colon polyps Sister    Breast cancer Maternal Aunt 50    Social History   Socioeconomic History   Marital status: Married    Spouse name: Not on file   Number of children: Not on file   Years of education: Not on file   Highest education level: Not on file  Occupational History   Not on file  Tobacco Use   Smoking status: Never   Smokeless tobacco: Never  Vaping Use   Vaping status: Never Used  Substance and Sexual Activity   Alcohol use: Not Currently   Drug use: Never   Sexual activity: Not on file  Other Topics Concern   Not on file  Social History Narrative   Not on file   Social Determinants of Health   Financial Resource Strain: Not on file  Food  Insecurity: Not on file  Transportation Needs: Not on file  Physical Activity: Not on file  Stress: Not on file  Social Connections: Not on file  Intimate Partner Violence: Not on file    Review of Systems:  All other review of systems negative except as mentioned in the HPI.  Physical Exam: Vital signs  BP (!) 161/81   Pulse 65   Temp (!) 96.6 F (35.9 C)   Ht 5\' 4"  (1.626 m)   Wt 267 lb (121.1 kg)   SpO2 100%   BMI 45.83 kg/m   General:   Alert,  Well-developed, well-nourished, pleasant and cooperative in NAD Airway:  Mallampati 1 Lungs:  Clear throughout to auscultation.   Heart:  Regular rate and rhythm; no murmurs, clicks, rubs,  or gallops. Abdomen:  Soft, nontender and nondistended. Normal bowel sounds.   Neuro/Psych:  Normal mood and affect. A and O x 3   Nnamdi Dacus E. Tomasa Rand, MD [facility]

## 2023-04-17 NOTE — Progress Notes (Signed)
Sedate, gd SR, tolerated procedure well, VSS, report to RN 

## 2023-04-18 ENCOUNTER — Telehealth: Payer: Self-pay | Admitting: *Deleted

## 2023-04-18 NOTE — Telephone Encounter (Signed)
 Attempted to call patient for their post-procedure follow-up call. No answer. Left voicemail.   

## 2023-04-23 ENCOUNTER — Encounter: Payer: Self-pay | Admitting: Gastroenterology

## 2023-07-03 ENCOUNTER — Encounter: Payer: Self-pay | Admitting: *Deleted

## 2023-07-03 ENCOUNTER — Inpatient Hospital Stay: Payer: Medicare HMO | Attending: Hematology and Oncology

## 2023-07-03 ENCOUNTER — Inpatient Hospital Stay: Payer: Medicare HMO | Admitting: Hematology and Oncology

## 2023-07-03 VITALS — BP 129/56 | HR 74 | Temp 97.7°F | Resp 18 | Ht 64.0 in | Wt 262.7 lb

## 2023-07-03 DIAGNOSIS — D0512 Intraductal carcinoma in situ of left breast: Secondary | ICD-10-CM | POA: Diagnosis present

## 2023-07-03 DIAGNOSIS — M2550 Pain in unspecified joint: Secondary | ICD-10-CM | POA: Insufficient documentation

## 2023-07-03 DIAGNOSIS — Z170 Estrogen receptor positive status [ER+]: Secondary | ICD-10-CM | POA: Insufficient documentation

## 2023-07-03 DIAGNOSIS — N898 Other specified noninflammatory disorders of vagina: Secondary | ICD-10-CM | POA: Insufficient documentation

## 2023-07-03 DIAGNOSIS — E785 Hyperlipidemia, unspecified: Secondary | ICD-10-CM | POA: Diagnosis not present

## 2023-07-03 DIAGNOSIS — Z006 Encounter for examination for normal comparison and control in clinical research program: Secondary | ICD-10-CM | POA: Diagnosis not present

## 2023-07-03 DIAGNOSIS — R232 Flushing: Secondary | ICD-10-CM | POA: Insufficient documentation

## 2023-07-03 DIAGNOSIS — Z79811 Long term (current) use of aromatase inhibitors: Secondary | ICD-10-CM | POA: Diagnosis not present

## 2023-07-03 DIAGNOSIS — I2510 Atherosclerotic heart disease of native coronary artery without angina pectoris: Secondary | ICD-10-CM | POA: Diagnosis not present

## 2023-07-03 LAB — RESEARCH LABS

## 2023-07-03 NOTE — Assessment & Plan Note (Addendum)
02/13/2020: Screening mammogram detected less than 1 cm cluster of calcifications UIQ left breast.  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   Anastrozole toxicities: 1. Hot flashes: mild-mod 2. Joint pains: turmeric is helping her joint stiffness and aches.  Turmeric appears to be helping her significantly. 3.  Vaginal dryness: Same as before: Patient would like to see our pelvic floor physical therapist to figure out why she does not have any sex drive and have a conversation and evaluation for this issue.       Breast cancer surveillance: 1.  Mammogram 07/31/2022: Stable biopsy site changes 10 o'clock position demonstrating DCIS.  Stable retroareolar left breast CSL.  Right breast benign. Next mammogram scheduled for 08/07/2023     Her husband had a couple of mini strokes but he is doing much better.  He has a neck mass that is going to be removed.   She has a 65-year-old granddaughter who keeps her busy.   31-month mammogram will be obtained for Comet clinical trial

## 2023-07-03 NOTE — Research (Unsigned)
AFT - 25: COMPARING AN OPERATION TO MONITORING, WITH OR WITHOUT ENDOCRINE THERAPY (COMET) FOR LOW RISK DCIS: A PHASE III PROSPECTIVE RANDOMIZED TRIAL    Patient in clinic this morning unaccompanied for her month 36 study visit.    PROs: The pt said that she was mailed the paper questionnaires by the study "about 3 weeks ago".  The pt said that she completed them and returned them back to the study.  The pt was thanked for completing her questionnaires in a timely manner.    LABS: The pt's research samples were drawn today.  The samples will be shipped today.  Shipment ID #[id number]   MEDICATION REVIEW: Patient reviews and verifies the current medication list is correct.  The pt confirmed that she continues to take her anastrozole daily as directed.     VITAL SIGNS: Vital signs are collected per study protocol.   MD/PROVIDER VISIT: Please refer to MD's note from today's visit.     Mammograms: The study confirmed that all future mammograms need to be scheduled from the last mammogram. The pt's bilateral mammogram is scheduled for 08/07/23.    ADVERSE EVENTS: The pt reported the following AE's as ongoing: arthralgia and hot flashes.  The pt states her arthralgia (joint stiffness) is moderate (grade 2).  She states her left knee is bothering her now.  She is using a cane.  The pt hopes to get a steroid injection for relief soon. The pt said that her hot flashes have become more bothersome lately.  She describes them as "moderate" and (grade 2) mostly at night.  She denies vaginal dryness (grade 1).  She states that her sexual problem is now related to her low libido.  The research nurse met with Dr. Pamelia Hoit about her AE's, and he provided attributions related to her anastrozole.    ADVERSE EVENT LOGDanaka Graves [id number] 07/03/2023   Adverse Event Log   Study/Protocol: Comet/AFT-25 Cycle: Month 36   Event Grade Onset Date Resolved Date Drug Name Attribution Treatment Comments  arthralgia  Grade 2 Baseline  Ongoing Anastrozole  Possible  uses a cane today Pt states she will schedule another steroid injection soon.  Hot flashes  Grade 2 Baseline Ongoing  Anastrozole Probable    pt reports increased hot flashes  Libido decreased Grade 1 07/03/23 Ongoing Anastrozole  Possible    MD ordered pt to see pelvic health physical therapist   Acute Coronary syndrome Grade 2  12/23/21 Ongoing  Anastrozole Unrelated  none     Grade 0 AE's:  fever, myalgia, hypertension, nausea, fracture, and allergic reaction.   Not evaluated Solicited AE's include:  Ischemia Cerebrovascular, Osteoporosis (pt denies any recent Dexa scans), and Cholesterol. Pt states she will get    DISPOSITION: Upon completion off all study requirements, patient was escorted to the scheduler.  The pt's month 42 visit is scheduled for 01/08/24.  The patient was thanked for her time and continued voluntary participation in this study. Patient Norma Graves has been provided direct contact information and is encouraged to contact Dr. Pamelia Hoit for any needs or questions.  Janan Ridge RN, BSN, CCRP Clinical Research Nurse Lead 07/03/2023 2:03 PM

## 2023-07-03 NOTE — Progress Notes (Signed)
Patient Care Team: Erskine Emery, NP as PCP - Denyse Amass, Margretta Ditty, RN as Oncology Nurse Navigator Donnelly Angelica, RN as Oncology Nurse Navigator Serena Croissant, MD as Medical Oncologist (Hematology and Oncology)  DIAGNOSIS:  Encounter Diagnosis  Name Primary?   Ductal carcinoma in situ (DCIS) of left breast Yes    SUMMARY OF ONCOLOGIC HISTORY: Oncology History  Ductal carcinoma in situ (DCIS) of left breast  02/13/2020 Initial Diagnosis   Screening mammogram detected less than 1 cm cluster of calcifications UIQ left breast.  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 The report date is 03/25/2020.  The Common Hereditary Cancers Panel offered by [facility] includes sequencing and/or deletion duplication testing of the following 48 genes: APC, ATM, AXIN2, BARD1, BMPR1A, BRCA1, BRCA2, BRIP1, CDH1, CDK4, CDKN2A (p14ARF), CDKN2A (p16INK4a), CHEK2, CTNNA1, DICER1, EPCAM (Deletion/duplication testing only), GREM1 (promoter region deletion/duplication testing only), KIT, MEN1, MLH1, MSH2, MSH3, MSH6, MUTYH, NBN, NF1, NHTL1, PALB2, PDGFRA, PMS2, POLD1, POLE, PTEN, RAD50, RAD51C, RAD51D, RNF43, SDHB, SDHC, SDHD, SMAD4, SMARCA4. STK11, TP53, TSC1, TSC2, and VHL.  The following genes were evaluated for sequence changes only: SDHA and HOXB13 c.251G>A variant only.     CHIEF COMPLIANT: Follow-up on anastrozole therapy  Discussed the use of AI scribe software for clinical note transcription with the patient, who gave verbal consent to proceed.  History of Present Illness   The patient, with a history of heart disease and  breast cancer, presents with a lack of sexual desire, which she attributes to her current medication regimen. She reports no physical discomfort or pain associated with intercourse, but rather a lack of interest or desire. She is seeking advice on how to manage this issue.  In addition to her primary complaint, the patient also reports experiencing hot flashes, which she believes are a side effect of her medication. These hot flashes occur once or twice a day, typically in the evening or when she is relaxing. She has been managing these symptoms by sitting down and using a fan to cool off.  The patient also expresses concern about potential heart issues. She reports experiencing shortness of breath, tiredness, and a tingling sensation in her arms, similar to symptoms she experienced during previous heart attacks. She has communicated these symptoms to her cardiologist and is keeping a close watch on her condition.         ALLERGIES:  is allergic to atorvastatin and tape.  MEDICATIONS:  Current Outpatient Medications  Medication Sig Dispense Refill   acetaminophen (TYLENOL) 325 MG tablet Take 325 mg by mouth every 6 (six) hours as needed.     anastrozole (ARIMIDEX) 1 MG tablet Take 1 tablet (1 mg total) by mouth daily. 90 tablet 3   aspirin 81 MG EC tablet Take 81 mg by mouth every evening.     azelastine (ASTELIN) 0.1 % nasal spray Place 1 spray into both nostrils 2 (two) times daily. Use in each nostril as directed 30 mL 12   calcium carbonate (OS-CAL) 1250 (500 Ca) MG chewable tablet Chew 200 mg by mouth 3 (three) times daily as needed for heartburn.  200 mg calcium (500 mg) chewable tablet     Cholecalciferol 1.25 MG (50000 UT) capsule TAKE 1 CAPSULE BY MOUTH ONCE WEEKLY FOR 12 WEEKS. REPEAT LABS WHEN DONE.     Cyanocobalamin (VITAMIN B-12 PO) Take 1,000 mcg by mouth daily.     Evolocumab (REPATHA SURECLICK) 140 MG/ML SOAJ Inject into the skin.     FLUoxetine (PROZAC) 10 MG capsule Take 10  mg by mouth every other day.     levothyroxine (SYNTHROID) 50 MCG tablet Take 50 mcg by mouth daily. Daily at 0600     metoprolol succinate (TOPROL-XL) 25 MG 24 hr tablet Take 25 mg by mouth daily.     Multiple Vitamin (MULTIVITAMIN) capsule Take 1 capsule by mouth daily.     nitroGLYCERIN (NITROSTAT) 0.4 MG SL tablet Place 0.4 mg under the tongue every 5 (five) minutes as needed for chest pain.     prasugrel (EFFIENT) 10 MG TABS tablet Take 10 mg by mouth daily.     REPATHA SURECLICK 140 MG/ML SOAJ Inject 1 mL into the skin every 30 (thirty) days.     No current facility-administered medications for this visit.    PHYSICAL EXAMINATION: ECOG PERFORMANCE STATUS: 1 - Symptomatic but completely ambulatory  Vitals:   07/03/23 1017  BP: (!) 129/56  Pulse: 74  Resp: 18  Temp: 97.7 F (36.5 C)  SpO2: 100%   Filed Weights   07/03/23 1017  Weight: 262 lb 11.2 oz (119.2 kg)     ASSESSMENT & PLAN:  Ductal carcinoma in situ (DCIS) of left breast 02/13/2020: Screening mammogram detected less than 1 cm cluster of calcifications UIQ left breast.  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   Anastrozole toxicities: 1. Hot flashes: mild-mod 2. Joint pains: turmeric is helping her joint stiffness and aches.  Turmeric appears to be helping her significantly. 3.  Vaginal dryness: Same as before: Patient would like to see our pelvic floor physical therapist to figure out why she does not have any sex drive and have a conversation and evaluation for this issue.       Breast cancer surveillance: 1.  Mammogram 07/31/2022: Stable biopsy site changes 10 o'clock position demonstrating DCIS.  Stable retroareolar left breast CSL.  Right breast benign. Next mammogram scheduled for 08/07/2023     Her  husband had a couple of mini strokes but he is doing much better.  He has a neck mass that is going to be removed.   She has a 49-year-old granddaughter who keeps her busy.   18-month mammogram will be obtained for Comet clinical trial   Cardiovascular Disease Reports symptoms similar to previous heart attacks, including shortness of breath, fatigue, and tingling in arms. Currently under care of cardiologist. -Continue current management under cardiologist. -Encourage patient to report any worsening or new symptoms immediately.  Hyperlipidemia Reports cholesterol levels are well-controlled on Repatha injections. -Continue Repatha as prescribed.  Follow-up in 6 months.          Orders Placed This Encounter  Procedures   MM DIAG BREAST TOMO UNI LEFT    Standing Status:   Future    Standing Expiration Date:   07/02/2024    Order Specific Question:   Reason for Exam (SYMPTOM  OR DIAGNOSIS REQUIRED)    Answer:   Left breast  mammograms with H/O DCIS on COMET trial    Order Specific Question:   Preferred imaging location?    Answer:   [facility]    Order Specific Question:   Release to patient    Answer:   Immediate   Ambulatory referral to Physical Therapy    Referral Priority:   Routine    Referral Type:   Physical Medicine    Referral Reason:   Specialty Services Required    Requested Specialty:   Physical Therapy    Number of Visits Requested:   1   The patient has a good understanding of the overall plan. she agrees with it. she will call with any problems that may develop before the next visit here. Total time spent: 30 mins including face to face time and time spent for planning, charting and co-ordination of care   Tamsen Meek, MD 07/03/23

## 2023-08-07 ENCOUNTER — Ambulatory Visit
Admission: RE | Admit: 2023-08-07 | Discharge: 2023-08-07 | Disposition: A | Discharge status: Home / Self Care | Payer: Medicare HMO | Source: Ambulatory Visit | Attending: Hematology and Oncology | Admitting: Hematology and Oncology

## 2023-08-07 ENCOUNTER — Other Ambulatory Visit: Payer: Self-pay | Admitting: Hematology and Oncology

## 2023-08-07 DIAGNOSIS — D0512 Intraductal carcinoma in situ of left breast: Secondary | ICD-10-CM

## 2023-09-05 NOTE — Therapy (Incomplete)
OUTPATIENT PHYSICAL THERAPY FEMALE PELVIC EVALUATION   Patient Name: Norma Graves MRN: [id number] DOB:April 02, 1951, 72 y.o., female Today's Date: 09/06/2023  END OF SESSION:  PT End of Session - 09/06/23 0932     Visit Number 1    Date for PT Re-Evaluation 11/01/23    Authorization Type [employer]    Authorization Time Period waiting on auth    Progress Note Due on Visit 10    PT Start Time 0800    PT Stop Time 0847    PT Time Calculation (min) 47 min    Activity Tolerance Patient tolerated treatment well    Behavior During Therapy [facility] for tasks assessed/performed             Past Medical History:  Diagnosis Date   Arthritis    Breast cancer (HCC)    CAD (coronary artery disease)    Elevated cholesterol    Family history of breast cancer    Family history of lung cancer    Family history of rectal cancer    Obesity    Thyroid disease    Past Surgical History:  Procedure Laterality Date   COLONOSCOPY  01/16/2014   Dr Effie Berkshire. 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   PCP: Erskine Emery, NP PCP - General  REFERRING PROVIDER: Serena Croissant, MD Ref Provider  REFERRING DIAG: [phone] (ICD-10-CM) - Ductal carcinoma in situ (DCIS) of left breast  Pelvic therapy/ sex  THERAPY DIAG:  Muscle weakness (generalized) - Plan: PT plan of care cert/re-cert  Other lack of coordination - Plan: PT plan of care cert/re-cert  Rationale for Evaluation and Treatment: Rehabilitation  ONSET DATE: 2023  SUBJECTIVE:                                                                                                                                                                                            SUBJECTIVE STATEMENT: Pt reports that her main complaint is having no sexual desire whatsoever and it is frustrating her husband, she is at a point where she does not want it. She is on medicine for her breast cancer, she is in a study. She tried to explain and he does not get it. Happened gradually. Has  been using coconut oil. It's frustrating. Her husband does not like the smell. He has had hard attack and strokes, can't get it up. Pt used to get toys, worked for a while, there is no pleasure.  Pt on low dose of prozac, wants her husband to take some too. Fluid intake: to be asked   PAIN:  Are you having pain? No PRECAUTIONS: None  RED FLAGS: None   WEIGHT BEARING RESTRICTIONS: No  FALLS:  Has patient fallen in last 6 months? Yes. 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   PLOF: Independent  PATIENT GOALS: to have improved sexual desire  PERTINENT HISTORY:  Breast cancer treatment- currently Sexual abuse: No  BOWEL MOVEMENT: Pain with bowel movement: No Type of bowel movement:Type (Bristol Stool Scale) 5, sometimes 2 Fully empty rectum: No , takes a stool softener- since she broke her shoulder Leakage: Yes: if she lifts some furniture Pads: No Fiber supplement: No  URINATION: Pain with urination: No Fully empty bladder: Yes:   Stream: Strong Urgency: No  Frequency: no 1.5 Leakage: Walking to the bathroom, Coughing, Sneezing, Laughing, Exercise, and Lifting Pads: Yes: 1  INTERCOURSE: Pain with intercourse:  no Ability to have vaginal penetration:  Yes:   Climax: hard to get Marinoff Scale: 1/3  PREGNANCY: Vaginal deliveries 2 Tearing Yes:   C-section deliveries 0 Currently pregnant  No  PROLAPSE: None   OBJECTIVE:  Note: Objective measures were completed at Evaluation unless otherwise noted.    COGNITION: Overall cognitive status: Within functional limits for tasks assessed     SENSATION: Light touch: Appears intact Proprioception: Appears intact  MUSCLE LENGTH: Hamstrings: Right 70 deg; Left 70 deg   LUMBAR SPECIAL TESTS:  Stork standing: Positive- hard to stand, dips bilateral hips   GAIT:  Comments: slow  POSTURE: rounded shoulders, forward head, increased lumbar lordosis, and anterior pelvic tilt   LUMBARAROM/PROM: grossly WFL    (Blank rows = not tested)  LOWER EXTREMITY MMT: 4-/5 grossly overall   PALPATION:   General  posterior vaginal wall swelling or bulging                External Perineal Exam within functional limitations                              Internal Pelvic Floor weak contraction  Patient confirms identification and approves PT to assess internal pelvic floor and treatment Yes  PELVIC MMT:   MMT eval  Vaginal 2/5  Internal Anal Sphincter   External Anal Sphincter   Puborectalis   Diastasis Recti   (Blank rows = not tested)        TONE: low  PROLAPSE: None noticed in hooklying  TODAY'S TREATMENT:  DATE: 09/06/2023  EVAL see below   PATIENT EDUCATION:  Education details: lubricants, relevant anatomy, expectations of PT Person educated: Patient Education method: Explanation, Demonstration, Tactile cues, Verbal cues, and Handouts Education comprehension: verbalized understanding and needs further education  HOME EXERCISE PROGRAM: [id number]  ASSESSMENT:  CLINICAL IMPRESSION: Patient is a 72 y.o. F who was seen today for physical therapy evaluation and treatment for dyspareunia and  low libido. She also presents with SUI and some fecal incontinence at times. She presents with  vaginal dryness, weakness throughout her core, pelvic floor and hips, decreased balance and will benefit from PT to improve quality of life. Referred her and her husband to sex counseling, discussed lubricants, recommended good clean love or slippery stuff, uberlube vs. Coconut oil- she has had increased yeast infections.  Pt's signs and symptoms consistent with pelvic floor weakness and poor coordination with core likely d/t long term use of breast cancer estrogen lowering meds and pt will benefit from PT to improve strength, libido and quality of life.   OBJECTIVE IMPAIRMENTS: Abnormal gait, decreased activity tolerance, decreased balance, decreased coordination, decreased knowledge of condition, difficulty walking, decreased ROM, decreased strength, increased fascial restrictions, impaired tone, obesity, and pain.   ACTIVITY LIMITATIONS: bending, squatting, and toileting  PARTICIPATION LIMITATIONS: occupation, interpersonal relationship with husband  PERSONAL FACTORS: Age and Time since onset of injury/illness/exacerbation are also affecting patient's functional outcome.   REHAB POTENTIAL: Good  CLINICAL DECISION MAKING: Stable/uncomplicated  EVALUATION COMPLEXITY: Low   GOALS: Goals reviewed with patient? Yes  SHORT TERM GOALS: Target date: 10/04/2023    Pt will be I with initial HEP Baseline: not I Goal status: INITIAL  2.  Pt will have improved pelvic floor strength by 1 degree Baseline: 2/5 Goal status: INITIAL  3.  Pt will report 50% decrease in stress urinary incontinence. Baseline:  Goal status: INITIAL   LONG TERM GOALS: Target date: 11/01/2023      Pt will soak 0 pads/ day Baseline: 1 Goal status: INITIAL  2.  Pt will have 0 fecal incontinence accidents Baseline: several/ week Goal status: INITIAL  3.  Pt will be I with her advanced HEP Baseline:  Goal status: INITIAL  4.  Pt will report increased libido  Baseline: not yet Goal status: INITIAL  5.  Pt  will report improved desire to have intercourse at least 1/ week              Baseline: 0/week              Goal status: INITIAL  6.  Pt's husband will go to counseling to understand wife's decreased libido better Baseline: does not go to councelling Goal status: INITIAL  PLAN:  PT FREQUENCY: 1x/week  PT DURATION: 8 weeks  PLANNED INTERVENTIONS: 97110-Therapeutic exercises, 97530- Therapeutic activity, 97112- Neuromuscular re-education, 97535- Self Care, 62130- Manual therapy, Balance training, Dry Needling, Joint mobilization, Joint manipulation, Spinal manipulation, Spinal mobilization, Scar mobilization, Moist heat, and Biofeedback  PLAN FOR NEXT SESSION: pelvic floor and  core strengthening   Akhil Piscopo, PT 09/06/23 4:33 PM

## 2023-09-06 ENCOUNTER — Ambulatory Visit: Payer: Medicare HMO | Attending: Hematology and Oncology | Admitting: Physical Therapy

## 2023-09-06 ENCOUNTER — Other Ambulatory Visit: Payer: Self-pay

## 2023-09-06 DIAGNOSIS — D0512 Intraductal carcinoma in situ of left breast: Secondary | ICD-10-CM | POA: Diagnosis not present

## 2023-09-06 DIAGNOSIS — M6281 Muscle weakness (generalized): Secondary | ICD-10-CM | POA: Insufficient documentation

## 2023-09-06 DIAGNOSIS — R278 Other lack of coordination: Secondary | ICD-10-CM | POA: Insufficient documentation

## 2023-09-06 NOTE — Patient Instructions (Signed)
  Lubrication Used for intercourse to reduce friction Avoid ones that have glycerin, nonoxynol-9, petroleum, propylene glycol, chlorhexidine gluconate, warming gels, tingling gels, icing or cooling gel, scented Avoid parabens due to a preservative similar to female sex hormone May need to be reapplied once or several times during sexual activity Can be applied to both partners genitals prior to vaginal penetration to minimize friction or irritation Prevent irritation and mucosal tears that cause post coital pain and increased the risk of vaginal and urinary tract infections Oil-based lubricants cannot be used with condoms due to breaking them down.  Least likely to irritate vaginal tissue.  Plant based-lubes are safe Silicone-based lubrication are thicker and last long and used for post-menopausal women  Vaginal Lubricators Here is a list of some suggested lubricators you can use for intercourse. Use the most hypoallergenic product.  You can place on you or your partner.  Slippery Stuff ( water based) Sylk or Sliquid Natural H2O ( good  if frequent UTI's)- walmart, amazon Sliquid organics silk-(aloe and silicone based ) [employer] (www.blossom-organics.com)- (aloe based ) Coconut oil, olive oil -not good with condoms  PJur Woman Nude- (water based) amazon Uberlube- ( silicon) Amazon Aloe Vera- Sprouts has an organic one Yes lubricant- (water based and has plant oil based similar to silicone) [employer] Platinum-Silicone, Target, [facility] Olive and Bee intimate cream-  www.oliveandbee.com.au Pink - [employer] Erosense Sync- walmart, amazon Coconu- coconu.com Desert [employer] Good Clean Love lubricants  Things to avoid in lubricants are glycerin, warming gels, tingling gels, icing or cooling  gels, and scented gels.  Also avoid Vaseline. KY jelly,  and Astroglide contain chlorhexidine which kills good bacteria(lactobacilli)  Things to avoid in the vaginal area Do not use  things to irritate the vulvar area No lotions- see below Soaps you  can use :Aveeno, [place], Good Clean Love cleanser if needed. Must be gentle No deodorants No douches Good to sleep without underwear to let the vaginal area to air out No scrubbing: spread the lips to let warm water rinse over labias and pat dry  Creams that can be used on the Vulva Area V [employer], walmart Vital V [place] Salve Julva- [employer] Botanical Pro-Meno Wild [place] Cream Coconut oil, olive oil Cleo by [employer] labial moisturizer -Amazon,  Desert [place] Releveum ( lidocaine) or Desert [employer] Yes Moisturizer

## 2023-09-13 ENCOUNTER — Ambulatory Visit: Payer: Medicare HMO | Admitting: Physical Therapy

## 2023-09-20 ENCOUNTER — Encounter: Payer: Self-pay | Admitting: Physical Therapy

## 2023-09-20 ENCOUNTER — Ambulatory Visit: Payer: Medicare HMO | Admitting: Physical Therapy

## 2023-09-20 DIAGNOSIS — R278 Other lack of coordination: Secondary | ICD-10-CM

## 2023-09-20 DIAGNOSIS — M6281 Muscle weakness (generalized): Secondary | ICD-10-CM

## 2023-09-20 NOTE — Patient Instructions (Signed)
 Fiber Table -- Grams of Fiber in Food  For additional information on fiber content in foods, go to www.caloriecounts.com  Food Products Serving Size Grams of Fiber/serving  Breads    Whole Wheat 1 slice 2.11  White 1 slice 0.5  Rye 1 slice 1.72  Cereals    Oat Bran 1 oz. 4.06  Wheat Bran 1 oz. 10.0  All Bran  cup 6.0  Optimum 1 cup 10.0  Whole Wheat Total 1 cup 3.0  Fiber One   cup 13.0  Shredded Wheat 1oz. 2.64  Corn Flakes 1 oz. 0.45  Cheerio's 1 1/3 cup 2.0  Oatmeal 1 oz. 2.5  Rice    Brown  cup 5.27  White  cup 1.42  Spaghetti 2 oz. 2.56  Vegetables (cooked)    Broccoli  cup 2.58  Brussels sprouts  cup 2.0  Cauliflower  cup 2.6  Carrots  cup 3.2  Corn  cup 3.03  Eggplant  cup 0.96  Green peas  cup 3.36  Lettuce (raw)  cup 0.24  Baked potato w/skin  cup 2.97  Spinach  cup 2.07  Squash  cup 2.87  Tomato (raw)  cup 1.17  Zucchini  cup 1.26  Beans    Green (canned)  cup 1.89  Kidney  cup 5.48  Lima  cup 4.25  Pinto  cup 5.93  Fresh fruits    Apple (with peel) 1 medium 2.76  Apricots 1 cup 3.13  Banana  1 medium 2.19  Black/Boysenberries 1 cup 7.2  Grapefruit 1 medium 3.61  Grapes 1 cup 1.12  Nectarine 1 medium 2.2  Orange 1 medium 3.14  Pear (with peel) 1 medium 4.32  Prunes 3 3.5  Raspberries 1 cup 7.5  Strawberries 1 cup 3.87  Watermelon 1 slice 1.93

## 2023-09-20 NOTE — Therapy (Signed)
OUTPATIENT PHYSICAL THERAPY FEMALE PELVIC TREATMENT   Patient Name: Norma Graves MRN: [id number] DOB:02-22-1951, 72 y.o., female Today's Date: 09/20/2023  END OF SESSION:  PT End of Session - 09/20/23 0925     Visit Number 2    Date for PT Re-Evaluation 11/01/23    Authorization Type [employer]    Authorization Time Period Cohere approved 8 visits 09/06/23-10/02/23 [id number]#[id number]  [employer] MCR  AUTHOR REQ    Progress Note Due on Visit 10    PT Start Time 0930    PT Stop Time 1015    PT Time Calculation (min) 45 min              Past Medical History:  Diagnosis Date   Arthritis    Breast cancer (HCC)    CAD (coronary artery disease)    Elevated cholesterol    Family history of breast cancer    Family history of lung cancer    Family history of rectal cancer    Obesity    Thyroid disease    Past Surgical History:  Procedure Laterality Date   COLONOSCOPY  01/16/2014   Dr Effie Berkshire. 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   PCP: Erskine Emery, NP PCP - General  REFERRING PROVIDER: Serena Croissant, MD Ref Provider  REFERRING DIAG: [phone] (ICD-10-CM) - Ductal carcinoma in situ (DCIS) of left breast  Pelvic therapy/ sex  THERAPY DIAG:  Muscle weakness (generalized)  Other lack of coordination  Rationale for Evaluation and Treatment: Rehabilitation  ONSET DATE: 2023  SUBJECTIVE:                                                                                                                                                                                           SUBJECTIVE STATEMENT: Pt reports that she is still not there,  but doing her exercises, seeing a little difference in emptying her bladder and letting her bowels move. Has been busy and stressed. Wants to get intercourse just over it. Reports that husband gets on the ipad with ear buds and does not like to get interrupted. When she interrupts him, he gets irritated and at times tells her to shut the hell up.   Pt reports that she cannot  het him to to understand that he is irritated, he does not understand that it is not her. Husband has changed mentally, physically and emotionally. He is like a kid.   Pt sometimes constipated.   PAIN:  Are you having pain? No PRECAUTIONS: None  RED FLAGS: None   WEIGHT BEARING RESTRICTIONS: No  FALLS:  Has patient fallen in last 6 months? Yes. 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   PLOF: Independent  PATIENT GOALS: to have improved sexual desire  PERTINENT HISTORY:  Breast cancer treatment- currently Sexual abuse: No  BOWEL MOVEMENT: Pain with bowel movement: No Type of bowel movement:Type (Bristol Stool Scale) 5, sometimes 2 Fully empty rectum: No , takes a stool softener- since she broke her shoulder Leakage: Yes: if she lifts some furniture Pads: No Fiber supplement: No  URINATION: Pain with urination: No Fully empty bladder: Yes:   Stream: Strong Urgency: No  Frequency: no 1.5 Leakage: Walking to the bathroom, Coughing, Sneezing, Laughing, Exercise, and Lifting Pads: Yes: 1  INTERCOURSE: Pain with intercourse:  no Ability to have vaginal penetration:  Yes:   Climax: hard to get Marinoff Scale: 1/3  PREGNANCY: Vaginal deliveries 2 Tearing Yes:   C-section deliveries 0 Currently pregnant No  PROLAPSE: None   OBJECTIVE:  Note: Objective measures were completed at Evaluation unless  otherwise noted.    COGNITION: Overall cognitive status: Within functional limits for tasks assessed     SENSATION: Light touch: Appears intact Proprioception: Appears intact  MUSCLE LENGTH: Hamstrings: Right 70 deg; Left 70 deg   LUMBAR SPECIAL TESTS:  Stork standing: Positive- hard to stand, dips bilateral hips   GAIT:  Comments: slow  POSTURE: rounded shoulders, forward head, increased lumbar lordosis, and anterior pelvic tilt   LUMBARAROM/PROM: grossly WFL    (Blank rows = not tested)  LOWER EXTREMITY MMT: 4-/5 grossly overall   PALPATION:   General  posterior vaginal wall swelling or bulging                External Perineal Exam within functional limitations                              Internal Pelvic Floor weak contraction  Patient confirms identification and approves PT to assess internal pelvic floor and treatment Yes  PELVIC MMT:   MMT eval  Vaginal 2/5  Internal Anal Sphincter   External Anal Sphincter   Puborectalis   Diastasis Recti   (Blank rows = not tested)        TONE: low  PROLAPSE: None noticed in hooklying  TODAY'S TREATMENT:  DATE: 09/20/2023       Neuro reed- ball press supine with transverse abdominis breath                     Horizontal abduction with theraband with transverse abdominis breath      Therapeutic activities- education on fiber, councelling, libido, arousal, HEP PATIENT EDUCATION:  Education details: lubricants, relevant anatomy, expectations of PT Person educated: Patient Education method: Explanation, Demonstration, Tactile cues, Verbal cues, and Handouts Education comprehension: verbalized understanding and needs further education  HOME EXERCISE PROGRAM: [id number]  ASSESSMENT:  CLINICAL IMPRESSION: Pt did well with her exercises, discussed pursuing counseling or  communicating with her husband. He seems angry per pt and has many health issues, erectile dysfunction, diabetes, they don't have good relationship anymore.Pt will benefit from PT to improve strength, libido and quality of life.     OBJECTIVE IMPAIRMENTS: Abnormal gait, decreased activity tolerance, decreased balance, decreased coordination, decreased knowledge of condition, difficulty walking, decreased ROM, decreased strength, increased fascial restrictions, impaired tone, obesity, and pain.   ACTIVITY LIMITATIONS: bending, squatting, and toileting  PARTICIPATION LIMITATIONS: occupation, interpersonal relationship with husband  PERSONAL FACTORS: Age and Time since onset of injury/illness/exacerbation are also affecting patient's functional outcome.   REHAB POTENTIAL: Good  CLINICAL DECISION MAKING: Stable/uncomplicated  EVALUATION COMPLEXITY: Low   GOALS: Goals reviewed with patient? Yes  SHORT TERM GOALS: Target date: 10/04/2023    Pt will be I with initial HEP Baseline: not I Goal status: INITIAL  2.  Pt will have improved pelvic floor strength by 1 degree Baseline: 2/5 Goal status: INITIAL  3.  Pt will report 50% decrease in stress urinary incontinence. Baseline:  Goal status: INITIAL   LONG TERM GOALS: Target date: 11/01/2023      Pt will soak 0 pads/ day Baseline: 1 Goal status: INITIAL  2.  Pt will have 0 fecal incontinence accidents Baseline: several/ week Goal status: INITIAL  3.  Pt will be I with her advanced HEP Baseline:  Goal status: INITIAL  4.  Pt will report increased libido  Baseline: not yet Goal status: INITIAL  5.  Pt will report improved desire to have intercourse at least 1/ week              Baseline: 0/week              Goal status: INITIAL  6.  Pt's husband will go to counseling to understand wife's decreased libido better Baseline: does not go to councelling Goal status: INITIAL  PLAN:  PT FREQUENCY: 1x/week  PT DURATION:  8 weeks  PLANNED INTERVENTIONS: 97110-Therapeutic exercises, 97530- Therapeutic activity, 97112- Neuromuscular re-education, 97535- Self Care, 13244- Manual therapy, Balance training, Dry Needling, Joint mobilization, Joint manipulation, Spinal manipulation, Spinal mobilization, Scar mobilization, Moist heat, and Biofeedback  PLAN FOR NEXT SESSION: pelvic floor and  core strengthening   Chyann Ambrocio, PT 09/20/23 9:26 AM

## 2023-10-04 ENCOUNTER — Encounter: Payer: Self-pay | Admitting: Physical Therapy

## 2023-10-04 ENCOUNTER — Ambulatory Visit: Payer: Medicare Other | Attending: Hematology and Oncology | Admitting: Physical Therapy

## 2023-10-04 DIAGNOSIS — M6281 Muscle weakness (generalized): Secondary | ICD-10-CM | POA: Diagnosis present

## 2023-10-04 DIAGNOSIS — R278 Other lack of coordination: Secondary | ICD-10-CM | POA: Diagnosis present

## 2023-10-04 NOTE — Therapy (Addendum)
 OUTPATIENT PHYSICAL THERAPY FEMALE PELVIC TREATMENT   Patient Name: Norma Graves MRN: [id number] DOB:12-Jan-1951, 73 y.o., female Today's Date: 10/04/2023  END OF SESSION:  PT End of Session - 10/04/23 0943     Visit Number 3    Date for PT Re-Evaluation 11/01/23    Authorization Type insurance inactive    Authorization Time Period Cohere approved 8 visits 09/06/23-10/02/23 [id number]  [employer]  AUTHOR REQ    Progress Note Due on Visit 10    PT Start Time 0940    PT Stop Time 1023    PT Time Calculation (min) 43 min    Activity Tolerance Patient tolerated treatment well    Behavior During Therapy WFL for tasks assessed/performed              Past Medical History:  Diagnosis Date   Arthritis    Breast cancer (HCC)    CAD (coronary artery disease)    Elevated cholesterol    Family history of breast cancer    Family history of lung cancer    Family history of rectal cancer    Obesity    Thyroid disease    Past Surgical History:  Procedure Laterality Date   COLONOSCOPY  01/16/2014   Dr Delynn. 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   PCP: Silvano Angeline FALCON, NP PCP - General  REFERRING PROVIDER: Odean Potts, MD Ref Provider  REFERRING DIAG: [phone] (ICD-10-CM) - Ductal carcinoma in situ (DCIS) of left breast  Pelvic therapy/ sex  THERAPY DIAG:  Muscle weakness (generalized)  Other lack of coordination  Rationale for Evaluation and Treatment: Rehabilitation  ONSET DATE: 2023  SUBJECTIVE:                                                                                                                                                                                            SUBJECTIVE STATEMENT: Pt reports that she has had a lot of coughing, incontinence is much better. Her libido has been better as well. Exercises have been helpful, reports that she did not pee on herself the other day when she fell of the bed. Had hard time getting up though. Wants to know if lubricants are ok to use with her  breast cancer meds and hx,  She also reports that she has been sick. Bronchitis.  They tried to have sex yesterday.       PAIN:  Are you having pain? No PRECAUTIONS: None  RED FLAGS: None   WEIGHT BEARING RESTRICTIONS: No  FALLS:  Has patient fallen in last 6 months? Yes. 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   PLOF: Independent  PATIENT GOALS: to have improved sexual desire  PERTINENT HISTORY:  Breast cancer treatment- currently Sexual abuse: No  BOWEL MOVEMENT: Pain with bowel movement: No Type of bowel movement:Type (Bristol Stool Scale) 5, sometimes 2 Fully empty rectum: No , takes a stool softener- since she broke her shoulder Leakage: Yes: if she lifts some furniture Pads: No Fiber supplement: No  URINATION: Pain with urination: No Fully empty bladder: Yes:   Stream: Strong Urgency: No  Frequency: no 1.5 Leakage: Walking to the bathroom, Coughing, Sneezing, Laughing, Exercise, and Lifting Pads: Yes: 1  INTERCOURSE: Pain with intercourse:  no Ability to have vaginal penetration:  Yes:   Climax: hard to get Marinoff Scale: 1/3  PREGNANCY: Vaginal deliveries 2 Tearing Yes:   C-section deliveries 0 Currently pregnant No  PROLAPSE: None   OBJECTIVE:  Note: Objective measures were completed at Evaluation unless otherwise noted.    COGNITION: Overall cognitive status:  Within functional limits for tasks assessed     SENSATION: Light touch: Appears intact Proprioception: Appears intact  MUSCLE LENGTH: Hamstrings: Right 70 deg; Left 70 deg   LUMBAR SPECIAL TESTS:  Stork standing: Positive- hard to stand, dips bilateral hips   GAIT:  Comments: slow  POSTURE: rounded shoulders, forward head, increased lumbar lordosis, and anterior pelvic tilt   LUMBARAROM/PROM: grossly WFL    (Blank rows = not tested)  LOWER EXTREMITY MMT: 4-/5 grossly overall   PALPATION:   General  posterior vaginal wall swelling or bulging                External Perineal Exam within functional limitations                              Internal Pelvic Floor weak contraction  Patient confirms identification and approves PT to assess internal pelvic floor and treatment Yes  PELVIC MMT:   MMT eval  Vaginal 2/5  Internal Anal Sphincter   External Anal Sphincter   Puborectalis   Diastasis Recti   (Blank rows = not tested)        TONE: low  PROLAPSE: None noticed in hooklying  TODAY'S TREATMENT:  DATE: 10/04/23     Therapeutic activities- review of exercises, HEP, husband's ED, husband's health and communication, education on lubricants    PATIENT EDUCATION:  Education details: lubricants, relevant anatomy, expectations of PT Person educated: Patient Education method: Explanation, Demonstration, Tactile cues, Verbal cues, and Handouts Education comprehension: verbalized understanding and needs further education  HOME EXERCISE PROGRAM: [id number]  ASSESSMENT:  CLINICAL IMPRESSION: Pt progressing well with exercises, has had improved libido, improved SUI with recent cold, coughing and falling off her bed.   Tx focus- review of HEP, progress, communication with husband, husbands health and ED- questioning benefits of Viagra. Pt  will call his cardiologist. Pt's husband likely needs PT d/t his multiple health issues as well. Husband does not drive. [facility]  Pt will continue to benefit from PT for legs, core and pelvic floor strengthening.    Reeval completed - asked auth for 8 visits  until 01/01/2024   OBJECTIVE IMPAIRMENTS: Abnormal gait, decreased activity tolerance, decreased balance, decreased coordination, decreased knowledge of condition, difficulty walking, decreased ROM, decreased strength, increased fascial restrictions, impaired tone, obesity, and pain.   ACTIVITY LIMITATIONS: bending, squatting, and toileting  PARTICIPATION LIMITATIONS: occupation, interpersonal relationship with husband  PERSONAL FACTORS: Age and Time since onset of injury/illness/exacerbation are also affecting patient's functional outcome.   REHAB POTENTIAL: Good  CLINICAL DECISION MAKING: Stable/uncomplicated  EVALUATION COMPLEXITY: Low   GOALS: Goals reviewed with patient? Yes  SHORT TERM GOALS: Target date: 10/04/2023    Pt will be I with initial HEP Baseline: not I Goal status: INITIAL  2.  Pt will have improved pelvic floor strength by 1 degree Baseline: 2/5 Goal status: INITIAL  3.  Pt will report 50% decrease in stress urinary incontinence. Baseline:  Goal status: INITIAL   LONG TERM GOALS: Target date: 11/01/2023      Pt will soak 0 pads/ day Baseline: 1 Goal status: INITIAL  2.  Pt will have 0 fecal incontinence accidents Baseline: several/ week Goal status: INITIAL  3.  Pt will be I with her advanced HEP Baseline:  Goal status: INITIAL  4.  Pt will report increased libido  Baseline: not yet Goal status: INITIAL  5.  Pt will report improved desire to have intercourse at least 1/ week              Baseline: 0/week              Goal status: INITIAL  6.  Pt's husband will go to counseling to understand wife's decreased libido better Baseline: does  not go to councelling Goal status: INITIAL  PLAN:  PT FREQUENCY: 1x/week  PT DURATION: 8 weeks  PLANNED INTERVENTIONS: 97110-Therapeutic exercises, 97530- Therapeutic activity, 97112- Neuromuscular re-education, 97535- Self Care, [id number]- Manual therapy, Balance training, Dry Needling, Joint mobilization, Joint manipulation, Spinal manipulation, Spinal mobilization, Scar mobilization, Moist heat, and Biofeedback  PLAN FOR NEXT SESSION: pelvic floor and  core strengthening - try machines in ortho gym  Lurline Caver, PT 10/04/23 10:27 AM

## 2023-10-11 ENCOUNTER — Ambulatory Visit: Payer: Medicare Other | Admitting: Physical Therapy

## 2023-10-11 ENCOUNTER — Encounter: Payer: Self-pay | Admitting: Physical Therapy

## 2023-10-11 DIAGNOSIS — M6281 Muscle weakness (generalized): Secondary | ICD-10-CM

## 2023-10-11 DIAGNOSIS — R278 Other lack of coordination: Secondary | ICD-10-CM

## 2023-10-11 NOTE — Therapy (Addendum)
 OUTPATIENT PHYSICAL THERAPY FEMALE PELVIC TREATMENT/ later date discharge   Patient Name: Norma Graves MRN: [id number] DOB:Jun 12, 1951, 73 y.o., female Today's Date: 10/11/2023  END OF SESSION:  PT End of Session - 10/11/23 0935     Visit Number 4    Date for PT Re-Evaluation 11/01/23    Authorization Time Period Cohere approved 8 visits 09/06/23-10/02/23 auth#[id number]  [employer]  AUTHOR REQ    PT Start Time 0930    PT Stop Time 1015    PT Time Calculation (min) 45 min    Activity Tolerance Patient tolerated treatment well    Behavior During Therapy [facility] for tasks assessed/performed               Past Medical History:  Diagnosis Date   Arthritis    Breast cancer (HCC)    CAD (coronary artery disease)    Elevated cholesterol    Family history of breast cancer    Family history of lung cancer    Family history of rectal cancer    Obesity    Thyroid disease    Past Surgical History:  Procedure Laterality Date   COLONOSCOPY  01/16/2014   Dr Effie Berkshire. 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   PCP: Erskine Emery, NP PCP - General  REFERRING PROVIDER: Serena Croissant, MD Ref Provider  REFERRING DIAG: [phone] (ICD-10-CM) - Ductal carcinoma in situ (DCIS) of left breast  Pelvic therapy/ sex  THERAPY DIAG:  Muscle weakness (generalized)  Other lack of coordination  Rationale for Evaluation and Treatment: Rehabilitation  ONSET DATE: 2023  SUBJECTIVE:                                                                                                                                                                                            SUBJECTIVE STATEMENT: Pt reports that she is feeling good, trying to get rid of crud stuff. Saw a heart doctor. Pt will see a urologist to get viagra.  Busy at work, sick this week, they did not try to have intercourse   PAIN:  Are you having pain? No PRECAUTIONS: None  RED FLAGS: None   WEIGHT BEARING RESTRICTIONS: No  FALLS:  Has patient fallen in last 6 months?  Yes. 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   PLOF: Independent  PATIENT GOALS: to have improved sexual desire  PERTINENT HISTORY:  Breast cancer treatment- currently Sexual abuse: No  BOWEL MOVEMENT: Pain with bowel movement: No Type of bowel movement:Type (Bristol Stool Scale) 5, sometimes 2 Fully empty rectum: No , takes a stool softener- since she broke her shoulder Leakage: Yes: if she lifts some furniture Pads: No Fiber supplement: No  URINATION: Pain with urination: No Fully empty bladder: Yes:   Stream: Strong Urgency: No  Frequency: no 1.5 Leakage: Walking to the bathroom, Coughing, Sneezing, Laughing, Exercise, and Lifting Pads: Yes: 1  INTERCOURSE: Pain with intercourse:  no Ability to have vaginal penetration:  Yes:   Climax: hard to get Marinoff Scale: 1/3  PREGNANCY: Vaginal deliveries 2 Tearing Yes:   C-section deliveries 0 Currently pregnant No  PROLAPSE: None   OBJECTIVE:  Note: Objective measures were completed at Evaluation unless otherwise noted.    COGNITION: Overall cognitive status: Within functional limits for tasks assessed     SENSATION: Light touch: Appears intact Proprioception: Appears intact  MUSCLE LENGTH: Hamstrings: Right 70 deg; Left 70 deg   LUMBAR SPECIAL TESTS:  Stork standing: Positive- hard to stand, dips bilateral  hips   GAIT:  Comments: slow  POSTURE: rounded shoulders, forward head, increased lumbar lordosis, and anterior pelvic tilt   LUMBARAROM/PROM: grossly WFL    (Blank rows = not tested)  LOWER EXTREMITY MMT: 4-/5 grossly overall   PALPATION:   General  posterior vaginal wall swelling or bulging                External Perineal Exam within functional limitations                              Internal Pelvic Floor weak contraction  Patient confirms identification and approves PT to assess internal pelvic floor and treatment Yes  PELVIC MMT:   MMT eval  Vaginal 2/5  Internal Anal Sphincter   External Anal Sphincter   Puborectalis   Diastasis Recti   (Blank rows = not tested)        TONE: low  PROLAPSE: None noticed in hooklying  TODAY'S TREATMENT:                                                                                                                              DATE: 10/11/23    Neuro reed- transverse abdominis breath with horizontal abduction with thera band  Ball press with transverse abdominis breath  Lat pull with transverse abdominis breath 1 plate Shoulder extensions with theraband (green)   PATIENT EDUCATION:  Education details: lubricants, relevant anatomy, expectations of PT Person educated: Patient Education method: Explanation, Demonstration,  Tactile cues, Verbal cues, and Handouts Education comprehension: verbalized understanding and needs further education  HOME EXERCISE PROGRAM: [id number]  ASSESSMENT:  CLINICAL IMPRESSION: Pt with improved SUI, reported that she engages her pelvic floor when she is working on her sewing machine. Does pelvic floor lifts. Incorporates her exercises in her day. Has been sick and busy at work, so not a ton of libido.  Pt will continue to benefit from PT for legs, core and pelvic floor strengthening.    Reeval completed - asked auth for 8 visits  until 01/01/2024   OBJECTIVE IMPAIRMENTS: Abnormal gait,  decreased activity tolerance, decreased balance, decreased coordination, decreased knowledge of condition, difficulty walking, decreased ROM, decreased strength, increased fascial restrictions, impaired tone, obesity, and pain.   ACTIVITY LIMITATIONS: bending, squatting, and toileting  PARTICIPATION LIMITATIONS: occupation, interpersonal relationship with husband  PERSONAL FACTORS: Age and Time since onset of injury/illness/exacerbation are also affecting patient's functional outcome.   REHAB POTENTIAL: Good  CLINICAL DECISION MAKING: Stable/uncomplicated  EVALUATION COMPLEXITY: Low   GOALS: Goals reviewed with patient? Yes  SHORT TERM GOALS: Target date: 10/04/2023    Pt will be I with initial HEP Baseline: not I Goal status: INITIAL  2.  Pt will have improved pelvic floor strength by 1 degree Baseline: 2/5 Goal status: INITIAL  3.  Pt will report 50% decrease in stress urinary incontinence. Baseline:  Goal status: INITIAL   LONG TERM GOALS: Target date: 11/01/2023      Pt will soak 0 pads/ day Baseline: 1 Goal status: INITIAL  2.  Pt will have 0 fecal incontinence accidents Baseline: several/ week Goal status: INITIAL  3.  Pt will be I with her advanced HEP Baseline:  Goal status: INITIAL  4.  Pt will report increased libido  Baseline: not yet Goal status: INITIAL  5.  Pt will report improved desire to have intercourse at least 1/ week              Baseline: 0/week              Goal status: INITIAL  6.  Pt's husband will go to counseling to understand wife's decreased libido better Baseline: does not go to councelling Goal status: INITIAL  PLAN:  PT FREQUENCY: 1x/week  PT DURATION: 8 weeks  PLANNED INTERVENTIONS: 97110-Therapeutic exercises, 97530- Therapeutic activity, 97112- Neuromuscular re-education, 97535- Self Care, 16109- Manual therapy, Balance training, Dry Needling, Joint mobilization, Joint manipulation, Spinal manipulation, Spinal  mobilization, Scar mobilization, Moist heat, and Biofeedback  PLAN FOR NEXT SESSION: pelvic floor and  core strengthening - try machines in ortho gym  Ellan Tess, PT 10/11/23 9:36 AM   PHYSICAL THERAPY DISCHARGE SUMMARY   Patient agrees to discharge. Patient goals were partially met. Patient is being discharged due to the patient's request.  Naeema Patlan, PT 01/10/24 9:06 AM

## 2023-11-15 ENCOUNTER — Encounter: Payer: Medicare HMO | Admitting: Physical Therapy

## 2023-11-22 ENCOUNTER — Ambulatory Visit: Payer: Medicare Other | Admitting: Physical Therapy

## 2023-11-29 ENCOUNTER — Ambulatory Visit: Payer: Medicare Other | Admitting: Physical Therapy

## 2023-12-06 ENCOUNTER — Ambulatory Visit: Payer: Medicare HMO | Admitting: Physical Therapy

## 2024-01-08 ENCOUNTER — Other Ambulatory Visit: Payer: Self-pay | Admitting: *Deleted

## 2024-01-08 ENCOUNTER — Inpatient Hospital Stay: Payer: Medicare HMO | Attending: Hematology and Oncology | Admitting: Hematology and Oncology

## 2024-01-08 ENCOUNTER — Encounter: Payer: Self-pay | Admitting: *Deleted

## 2024-01-08 VITALS — BP 140/65 | HR 54 | Temp 97.9°F | Resp 18 | Ht 64.0 in | Wt 271.2 lb

## 2024-01-08 DIAGNOSIS — R232 Flushing: Secondary | ICD-10-CM | POA: Insufficient documentation

## 2024-01-08 DIAGNOSIS — Z79811 Long term (current) use of aromatase inhibitors: Secondary | ICD-10-CM | POA: Insufficient documentation

## 2024-01-08 DIAGNOSIS — D0512 Intraductal carcinoma in situ of left breast: Secondary | ICD-10-CM

## 2024-01-08 DIAGNOSIS — Z006 Encounter for examination for normal comparison and control in clinical research program: Secondary | ICD-10-CM | POA: Insufficient documentation

## 2024-01-08 DIAGNOSIS — H269 Unspecified cataract: Secondary | ICD-10-CM | POA: Insufficient documentation

## 2024-01-08 DIAGNOSIS — Z170 Estrogen receptor positive status [ER+]: Secondary | ICD-10-CM | POA: Insufficient documentation

## 2024-01-08 DIAGNOSIS — Z1721 Progesterone receptor positive status: Secondary | ICD-10-CM | POA: Insufficient documentation

## 2024-01-08 MED ORDER — ANASTROZOLE 1 MG PO TABS: 1.0000 mg | ORAL_TABLET | Freq: Every day | ORAL | 3 refills | Status: AC

## 2024-01-08 NOTE — Assessment & Plan Note (Signed)
 02/13/2020: Screening mammogram detected less than 1 cm cluster of calcifications UIQ left breast.  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   Anastrozole toxicities: 1. Hot flashes: mild-mod 2. Joint pains: turmeric is helping her joint stiffness and aches.  Turmeric appears to be helping her significantly. 3.  Vaginal dryness: Same as before: Seeing physical therapist for pelvic issues    Breast cancer surveillance: 1.  Mammogram 08/07/2023 bilateral: No new or residual suspicious calcifications at the site of the previous DCIS.  Stable biopsy-proven CSL.   Her husband had a couple of mini strokes but he is doing much better.  He has a neck mass that is going to be removed.   She has a 76-year-old granddaughter who keeps her busy.   31-month mammogram will be obtained for Comet clinical trial

## 2024-01-08 NOTE — Progress Notes (Signed)
 Patient Care Team: Erskine Emery, NP as PCP - Denyse Amass, Margretta Ditty, RN as Oncology Nurse Navigator Donnelly Angelica, RN as Oncology Nurse Navigator Serena Croissant, MD as Medical Oncologist (Hematology and Oncology)  DIAGNOSIS:  Encounter Diagnosis  Name Primary?   Ductal carcinoma in situ (DCIS) of left breast Yes    SUMMARY OF ONCOLOGIC HISTORY: Oncology History  Ductal carcinoma in situ (DCIS) of left breast  02/13/2020 Initial Diagnosis   Screening mammogram detected less than 1 cm cluster of calcifications UIQ left breast.  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 The report date is 03/25/2020.  The Common Hereditary Cancers Panel offered by [facility] includes sequencing and/or deletion duplication testing of the following 48 genes: APC, ATM, AXIN2, BARD1, BMPR1A, BRCA1, BRCA2, BRIP1, CDH1, CDK4, CDKN2A (p14ARF), CDKN2A (p16INK4a), CHEK2, CTNNA1, DICER1, EPCAM (Deletion/duplication testing only), GREM1 (promoter region deletion/duplication testing only), KIT, MEN1, MLH1, MSH2, MSH3, MSH6, MUTYH, NBN, NF1, NHTL1, PALB2, PDGFRA, PMS2, POLD1, POLE, PTEN, RAD50, RAD51C, RAD51D, RNF43, SDHB, SDHC, SDHD, SMAD4, SMARCA4. STK11, TP53, TSC1, TSC2, and VHL.  The following genes were evaluated for sequence changes only: SDHA and HOXB13 c.251G>A variant only.     CHIEF COMPLIANT: F/U of DCIS on COMET trial  HISTORY OF PRESENT ILLNESS:   History of Present Illness The patient, with a history of breast cancer and recent cataract surgery, presents with concerns about new onset hot flashes and night sweats. She is  currently on hormone therapy for her breast cancer and believes these symptoms may be related to her medication. She expresses concern about the effectiveness of her medication in light of these new symptoms.  In addition to her primary concern, the patient also discusses her recent cataract surgery and upcoming surgery for her other eye. She is currently on medication related to this procedure.  The patient also mentions an improvement in her libido following pelvic rehab. However, she has had to pause her therapy due to her eye surgeries and other commitments.  Lastly, the patient discusses the stress related to her husband's health and political tensions. Her husband has recently been put on insulin for his diabetes, and she is trying to manage her stress levels amidst her husband's health issues and the current political climate.     ALLERGIES:  is allergic to atorvastatin and tape.  MEDICATIONS:  Current Outpatient Medications  Medication Sig Dispense Refill   acetaminophen (TYLENOL) 325 MG tablet Take 325 mg by mouth every 6 (six) hours as needed.     anastrozole (ARIMIDEX) 1 MG tablet Take 1 tablet (1 mg total) by mouth daily. 90 tablet 3   aspirin 81 MG EC tablet Take 81 mg by mouth every evening.     azelastine (ASTELIN) 0.1 % nasal spray Place 1 spray into both nostrils 2 (two) times daily. Use in each nostril as directed 30 mL 12   calcium carbonate (OS-CAL) 1250 (500 Ca) MG chewable tablet Chew 200 mg by mouth 3 (three) times daily as needed for heartburn. 200 mg calcium (500 mg) chewable tablet     Cholecalciferol 1.25 MG (50000 UT) capsule TAKE  1 CAPSULE BY MOUTH ONCE WEEKLY FOR 12 WEEKS. REPEAT LABS WHEN DONE.     Cyanocobalamin (VITAMIN B-12 PO) Take 1,000 mcg by mouth daily.     Evolocumab (REPATHA SURECLICK) 140 MG/ML SOAJ Inject into the skin.     FLUoxetine (PROZAC) 10 MG capsule Take 10 mg by mouth every other day.     levothyroxine (SYNTHROID) 50 MCG tablet Take 50 mcg  by mouth daily. Daily at 0600     metoprolol succinate (TOPROL-XL) 25 MG 24 hr tablet Take 25 mg by mouth daily.     Multiple Vitamin (MULTIVITAMIN) capsule Take 1 capsule by mouth daily.     nitroGLYCERIN (NITROSTAT) 0.4 MG SL tablet Place 0.4 mg under the tongue every 5 (five) minutes as needed for chest pain.     prasugrel (EFFIENT) 10 MG TABS tablet Take 10 mg by mouth daily.     REPATHA SURECLICK 140 MG/ML SOAJ Inject 1 mL into the skin every 30 (thirty) days.     No current facility-administered medications for this visit.    PHYSICAL EXAMINATION: ECOG PERFORMANCE STATUS: 1 - Symptomatic but completely ambulatory  Vitals:   01/08/24 1011  BP: (!) 140/65  Pulse: (!) 54  Resp: 18  Temp: 97.9 F (36.6 C)  SpO2: 99%   Filed Weights   01/08/24 1011  Weight: 271 lb 3.2 oz (123 kg)      ASSESSMENT & PLAN:  Ductal carcinoma in situ (DCIS) of left breast 02/13/2020: Screening mammogram detected less than 1 cm cluster of calcifications UIQ left breast.  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   Anastrozole toxicities: 1. Hot flashes: mild-mod 2. Joint pains: turmeric is helping her joint stiffness and aches.  Turmeric appears to be helping her significantly. 3.  Vaginal dryness and dec libido: Improving   Breast cancer surveillance: 1.  Mammogram 08/07/2023 bilateral: No new or residual suspicious calcifications at the site of the previous DCIS.  Stable biopsy-proven CSL.   Her husband had a couple of mini strokes but he is doing much better.  He has a neck mass that is going to be removed.   She has a 28-year-old granddaughter who keeps her busy.   42-month mammogram will be obtained for Comet clinical trial ------------------------------------- Assessment and  Plan Assessment & Plan Ductal carcinoma in situ (DCIS) of left breast Hot flashes and night sweats from anastrozole indicate effective estrogen suppression. No significant drug interactions. Mammograms show no recurrence. Post-surgery recovery is good. - Continue anastrozole 1 mg oral daily. - Ensure sufficient refills of anastrozole at [facility] in [place]. - Schedule mammogram in November for bilateral screening.  Cataracts Improved vision in right eye post-surgery. Left eye surgery scheduled. - Proceed with cataract surgery on the left eye on April 23.  Pelvic rehabilitation Improved libido and sexual function from exercises. Therapy paused due to travel and eye surgery. - Continue pelvic rehabilitation exercises as tolerated.      Orders Placed This Encounter  Procedures   MM DIAG BREAST TOMO BILATERAL    Standing Status:   Future    Expected Date:   08/07/2024    Expiration Date:   01/07/2025    Reason for Exam (SYMPTOM  OR DIAGNOSIS REQUIRED):   COMET study active surveillance    [facility]erred [facility]    Release to patient:   Immediate   The patient has a good understanding of the overall plan. she agrees with it. she will call with any problems that may develop before the next visit here. Total time spent: 30 mins including face to face time and time spent for planning, charting and co-ordination of care   Tamsen Meek, MD 01/08/24

## 2024-01-08 NOTE — Research (Signed)
 AFT - 25: COMPARING AN OPERATION TO MONITORING, WITH OR WITHOUT ENDOCRINE THERAPY (COMET) FOR LOW RISK DCIS: A PHASE III PROSPECTIVE RANDOMIZED TRIAL    Patient in clinic this morning unaccompanied for her month 42 study visit.    PROs: Quality of Life questionnaires are not due at this visit.  The pt was reminded that she will need to complete her paper questionnaires at her month 48 visit in October 2025.    LABS: Blood samples are not due at this visit.  The pt was reminded that her annual blood samples will be drawn at her month 48 visit in October 2025.    MEDICATION REVIEW: Patient reviews and verifies the current medication list is correct.  The pt confirmed that she continues to take her anastrozole daily as directed.     VITAL SIGNS: Vital signs are collected per study protocol.   MD/PROVIDER VISIT: Please refer to MD's note from today's visit.     Mammograms: The pt's next mammogram is scheduled for 02/05/24.  The pt is aware of her upcoming scan.     ADVERSE EVENTS: The pt reported the following AE's as ongoing: arthralgia and hot flashes.  The pt states her arthralgia (knee pain bilaterally) is moderate (grade 2). The pt hopes to get a steroid injection for relief soon. The pt said that her hot flashes have become more bothersome lately.  She describes them as "moderate" and (grade 2) with some night sweats.  She states that her low libido is ongoing, but she reports some improvement with her pelvic health therapy. The pt denies any COVID-19 infections.   The pt states she is being followed by cardiology.  She reports increasing her Toprol to BID, and she denies any new cardiac problems.  Acute coronary syndrome - grade 2.  The research nurse met with Dr. Pamelia Hoit about her AE's today, and he provided attributions related to her anastrozole.    ADVERSE EVENT LOGDejae Graves [id number] 01/08/2024   Adverse Event Log   Study/Protocol: Comet/AFT-25 Cycle: Month 42   Event Grade  Onset Date Resolved Date Drug Name Attribution Treatment Comments  Arthralgia Grade 2 Baseline  Ongoing Anastrozole  Possible  Uses tumeric Reports bilateral knee pain. Pt states she needs a steroid injection for relief.  Hot flashes  Grade 2 Baseline Ongoing  Anastrozole Probable  none   pt reports increased hot flashes with night sweats. Does not affect her QOL  Libido decreased Grade 1 07/03/23 Ongoing Anastrozole  Probable  Pelvic health therapy States pelvic health therapy has been helpful. Will continue in the future.    Acute Coronary syndrome Grade 2  Baseline  Ongoing  Anastrozole Unrelated  Toprol BID  Pt being followed by cardiology.  Pt mentioned that her Toprol was changed to BID based on her recent heart evaluation  Myalgia Grade 1  01/08/2024 Ongoing  Anastrozole  Possible  none Mainly shoulders/hands  Cataract Grade 3 12/26/23 12/26/23 Anastrozole Unrelated surgery  Pt reports she had successful cataract surgery on her right eye.  Left eye is scheduled for 01/23/24.   Grade 0 AE's:  fever, hypertension, nausea, fracture, cholesterol high,and allergic reaction.   Not evaluated Solicited AE's include:  Ischemia Cerebrovascular, Osteoporosis (pt denies any recent Dexa scans).   DISPOSITION: Upon completion off all study requirements, patient was escorted to the scheduler.  The pt's month 48 visit is scheduled for 07/09/24.  The patient was thanked for her time and continued voluntary participation in this study.  Patient Norma Graves has been provided direct contact information and is encouraged to contact Dr. Pamelia Hoit for any needs or questions.  Janan Ridge RN, BSN, CCRP Clinical Research Nurse Lead 01/08/2024 10:54 AM

## 2024-01-30 ENCOUNTER — Encounter: Payer: Self-pay | Admitting: Hematology and Oncology

## 2024-02-05 ENCOUNTER — Ambulatory Visit
Admission: RE | Admit: 2024-02-05 | Discharge: 2024-02-05 | Disposition: A | Discharge status: Home / Self Care | Source: Ambulatory Visit | Attending: Hematology and Oncology | Admitting: Hematology and Oncology

## 2024-02-05 DIAGNOSIS — D0512 Intraductal carcinoma in situ of left breast: Secondary | ICD-10-CM

## 2024-05-14 ENCOUNTER — Encounter: Payer: Self-pay | Admitting: *Deleted

## 2024-07-07 ENCOUNTER — Inpatient Hospital Stay: Attending: Hematology and Oncology

## 2024-07-07 ENCOUNTER — Inpatient Hospital Stay: Admitting: Hematology and Oncology

## 2024-07-07 ENCOUNTER — Encounter: Payer: Self-pay | Admitting: *Deleted

## 2024-07-07 VITALS — BP 139/73 | HR 58 | Temp 97.3°F | Resp 14 | Ht 64.0 in | Wt 271.9 lb

## 2024-07-07 DIAGNOSIS — Z006 Encounter for examination for normal comparison and control in clinical research program: Secondary | ICD-10-CM | POA: Diagnosis not present

## 2024-07-07 DIAGNOSIS — D0512 Intraductal carcinoma in situ of left breast: Secondary | ICD-10-CM | POA: Diagnosis present

## 2024-07-07 DIAGNOSIS — Z1721 Progesterone receptor positive status: Secondary | ICD-10-CM | POA: Insufficient documentation

## 2024-07-07 DIAGNOSIS — R42 Dizziness and giddiness: Secondary | ICD-10-CM | POA: Insufficient documentation

## 2024-07-07 DIAGNOSIS — Z170 Estrogen receptor positive status [ER+]: Secondary | ICD-10-CM | POA: Insufficient documentation

## 2024-07-07 DIAGNOSIS — Z780 Asymptomatic menopausal state: Secondary | ICD-10-CM | POA: Diagnosis not present

## 2024-07-07 DIAGNOSIS — Z79811 Long term (current) use of aromatase inhibitors: Secondary | ICD-10-CM | POA: Insufficient documentation

## 2024-07-07 DIAGNOSIS — R002 Palpitations: Secondary | ICD-10-CM | POA: Insufficient documentation

## 2024-07-07 LAB — RESEARCH LABS

## 2024-07-07 NOTE — Research (Signed)
 AFT - 25: COMPARING AN OPERATION TO MONITORING, WITH OR WITHOUT ENDOCRINE THERAPY (COMET) FOR LOW RISK DCIS: A PHASE III PROSPECTIVE RANDOMIZED TRIAL   Patient in clinic this morning with her husband for her month 48 study visit. She reports that she has been doing well. The pt denies any serious health conditions or hospitalizations since her last visit.     PROs: The pt received her quality of life questionnaires in the mail.   The pt stated she has completed her questionnaires and mailed them back to the study.  The pt was thanked for completing her questionnaires in a timely manner.     LABS: Annual blood samples were drawn and shipped today.     MEDICATION REVIEW: Patient reviews and verifies the current medication list is correct.  The pt confirmed that she continues to take her anastrozole  every day as directed.     VITAL SIGNS: Vital signs are collected per study protocol.   MD/PROVIDER VISIT: Please refer to MD's note from today's visit.     Mammograms: The pt's next mammogram is scheduled for 08/11/24.  The pt is aware of her upcoming scan.     ADVERSE EVENTS: The pt reported the following AE's as ongoing: arthralgia and hot flashes.  The pt states her arthralgia (knee pain bilaterally) is moderate (grade 2). The pt recently received steroid injections with good relief of her pain. The pt said that her hot flashes have become milder in nature lately (grade 1).  She states that her low libido is ongoing (grade 1).  The pt denies any COVID-19 infections.   The pt states she is being followed by cardiology.  She reports more symptoms (SOB, fatigue, lightheadedness) lately.  She states she wore a heart monitor for 10 days.  The research nurse met with Dr. Odean about her AE's today, and he provided attributions related to her anastrozole .    ADVERSE EVENT LOGSigna Graves [id number] Month 48   Event Grade Onset Date Resolved Date Drug Name Attribution Treatment Comments   Arthralgia Grade 2 Baseline  Ongoing Anastrozole   Possible  Uses tumeric Reports bilateral knee pain. Pt states she had steroid injections in August with some relief.  Hot flashes  Grade 1 Baseline Ongoing  Anastrozole  Probable  none   pt reports milder hot flashes. Does not affect her QOL  Libido decreased Grade 1 07/03/23 Ongoing Anastrozole   Probable  none States pelvic health therapy has been helpful.    Acute Coronary syndrome Grade 2  Baseline  Ongoing  Anastrozole  Unrelated  Toprol BID  Pt being followed by cardiology.  Pt mentioned that she has been symptomatic.  Echo WNL per patient report.   Vaginal dryness  1 07/07/24 Ongoing  Anastrozole  Possible   Symptoms improving  Grade 0 AE's:  fever, hypertension, nausea, myalgia, fracture, and allergic reaction.   Not evaluated Solicited AE's include:  Ischemia Cerebrovascular, Osteoporosis, Cholesterol High   DISPOSITION: Upon completion off all study requirements, patient was escorted to the scheduler.  The pt's month 54 visit is scheduled for 01/05/25.  Dr. Odean ordered a bone density exam for the pt.  The patient was thanked for her time and continued voluntary participation in this study. Patient Norma Graves has been provided direct contact information and is encouraged to contact Dr. Gudena for any needs or questions.  Norma FREDRIK Sandifer RN, BSN, CCRP Clinical Research Nurse Lead 07/07/2024 10:33 AM

## 2024-07-07 NOTE — Assessment & Plan Note (Signed)
 02/13/2020: Screening mammogram detected less than 1 cm cluster of calcifications UIQ left breast.  Core biopsy showed intermediate grade DCIS ER 100%, PR 100%, Tis NX stage 0 Genetic testing: Negative   Current treatment: COMET study participation, randomized to active surveillance   Treatment plan: Risk reduction with anastrozole  1 mg daily.   Anastrozole  toxicities: 1. Hot flashes: mild-mod 2. Joint pains: turmeric is helping her joint stiffness and aches.  Turmeric appears to be helping her significantly. 3.  Vaginal dryness and dec libido: Improving   Breast cancer surveillance: 1.  Mammogram 02/11/2024 bilateral: No new or residual suspicious calcifications at the site of the previous DCIS.     Her husband had a couple of mini strokes but he is doing much better.  He has a neck mass that is going to be removed.   She has a 75-year-old granddaughter who keeps her busy.   79-month mammogram will be obtained for Comet clinical trial

## 2024-07-07 NOTE — Progress Notes (Signed)
 Patient Care Team: Silvano Angeline FALCON, NP as PCP - General Tyree Nanetta SAILOR, RN as Oncology Nurse Navigator Odean Potts, MD as Medical Oncologist (Hematology and Oncology)  DIAGNOSIS:  Encounter Diagnosis  Name Primary?   Ductal carcinoma in situ (DCIS) of left breast Yes    SUMMARY OF ONCOLOGIC HISTORY: Oncology History  Ductal carcinoma in situ (DCIS) of left breast  02/13/2020 Initial Diagnosis   Screening mammogram detected less than 1 cm cluster of calcifications UIQ left breast.  Core biopsy showed intermediate grade DCIS ER 100%, PR 100%, Tis NX stage 0   03/02/2020 Cancer Staging   Staging form: Breast, AJCC 8th Edition - Clinical stage from 03/02/2020: Stage 0 (cTis (DCIS), cN0, cM0, ER+, PR+, HER2: Not Assessed) - Signed by Odean Potts, MD on 03/02/2020   03/25/2020 Genetic Testing   Negative genetic testing:  No pathogenic variants detected on the [facility] Common Hereditary Cancers Panel. The report date is 03/25/2020.  The Common Hereditary Cancers Panel offered by [facility] includes sequencing and/or deletion duplication testing of the following 48 genes: APC, ATM, AXIN2, BARD1, BMPR1A, BRCA1, BRCA2, BRIP1, CDH1, CDK4, CDKN2A (p14ARF), CDKN2A (p16INK4a), CHEK2, CTNNA1, DICER1, EPCAM (Deletion/duplication testing only), GREM1 (promoter region deletion/duplication testing only), KIT, MEN1, MLH1, MSH2, MSH3, MSH6, MUTYH, NBN, NF1, NHTL1, PALB2, PDGFRA, PMS2, POLD1, POLE, PTEN, RAD50, RAD51C, RAD51D, RNF43, SDHB, SDHC, SDHD, SMAD4, SMARCA4. STK11, TP53, TSC1, TSC2, and VHL.  The following genes were evaluated for sequence changes only: SDHA and HOXB13 c.251G>A variant only.     CHIEF COMPLIANT: Follow-up on anastrozole  therapy, patient enrolled in Comet clinical trial  HISTORY OF PRESENT ILLNESS:   History of Present Illness Norma Graves is a 73 year old female with a history of breast cancer on anastrozole  who presents for a follow-up visit.  She is due for a mammogram on  November 10th. She is on anastrozole  for breast cancer. She is trying to recall the last time she had a bone density scan, which is relevant due to the potential impact of anastrozole  on bone density.  She engages in regular physical activity, including walking and chasing her granddaughter. [place]     ALLERGIES:  is allergic to atorvastatin and tape.  MEDICATIONS:  Current Outpatient Medications  Medication Sig Dispense Refill   acetaminophen (TYLENOL) 325 MG tablet Take 325 mg by mouth every 6 (six) hours as needed.     anastrozole  (ARIMIDEX ) 1 MG tablet Take 1 tablet (1 mg total) by mouth daily. 90 tablet 3   aspirin 81 MG EC tablet Take 81 mg by mouth every evening.     azelastine  (ASTELIN ) 0.1 % nasal spray Place 1 spray into both nostrils 2 (two) times daily. Use in each nostril as directed 30 mL 12   calcium carbonate (OS-CAL) 1250 (500 Ca) MG chewable tablet Chew 200 mg by mouth 3 (three) times daily as needed for heartburn. 200 mg calcium (500 mg) chewable tablet     Cholecalciferol 1.25 MG (50000 UT) capsule TAKE 1 CAPSULE BY MOUTH ONCE WEEKLY FOR 12 WEEKS. REPEAT LABS WHEN DONE.     Cyanocobalamin (VITAMIN B-12 PO) Take 1,000 mcg by mouth daily.     Evolocumab (REPATHA SURECLICK) 140 MG/ML SOAJ Inject into the skin.     FLUoxetine (PROZAC) 10 MG capsule Take 10 mg by mouth every other day.     levothyroxine (SYNTHROID) 50 MCG tablet Take 50  mcg by mouth daily. Daily at 0600     metoprolol succinate (TOPROL-XL) 25 MG 24 hr tablet Take 25 mg by mouth daily.     Multiple Vitamin (MULTIVITAMIN) capsule Take 1 capsule by mouth daily.     nitroGLYCERIN (NITROSTAT) 0.4 MG SL tablet Place 0.4 mg under the tongue every 5 (five) minutes as needed for chest pain.     prasugrel (EFFIENT) 10 MG TABS tablet Take 10 mg by mouth daily.     REPATHA SURECLICK 140 MG/ML SOAJ Inject 1 mL into the skin every 30 (thirty)  days.     No current facility-administered medications for this visit.    PHYSICAL EXAMINATION: ECOG PERFORMANCE STATUS: 1 - Symptomatic but completely ambulatory  Vitals:   07/07/24 0831  BP: 139/73  Pulse: (!) 58  Resp: 14  Temp: (!) 97.3 F (36.3 C)  SpO2: 100%   Filed Weights   07/07/24 0831  Weight: 271 lb 14.4 oz (123.3 kg)      ASSESSMENT & PLAN:  Ductal carcinoma in situ (DCIS) of left breast 02/13/2020: Screening mammogram detected less than 1 cm cluster of calcifications UIQ left breast.  Core biopsy showed intermediate grade DCIS ER 100%, PR 100%, Tis NX stage 0 Genetic testing: Negative   Current treatment: COMET study participation, randomized to active surveillance   Treatment plan: Risk reduction with anastrozole  1 mg daily.   Anastrozole  toxicities: 1. Hot flashes: mild-mod 2. Joint pains: turmeric is helping her joint stiffness and aches.  Turmeric appears to be helping her significantly. 3.  Vaginal dryness and dec libido: Improving   Breast cancer surveillance: 1.  Mammogram 02/11/2024 bilateral: No new or residual suspicious calcifications at the site of the previous DCIS.   2.  Breast exam done 03/21/2024: Benign   Her husband had a couple of mini strokes but he is doing much better.    She has a 36-year-old granddaughter who keeps her busy.   36-month mammogram will be obtained for Comet clinical trial   Assessment & Plan Ductal carcinoma in situ of the left breast On anastrozole , four years post-diagnosis, with surveillance mammogram scheduled. - Perform mammogram on November 10th. - Continue anastrozole  as prescribed.  Osteoporosis risk Planned bone density screening. On high-dose vitamin D and calcium carbonate as needed. - Schedule DEXA scan for bone density assessment. - Continue high-dose vitamin D supplementation once weekly. - Continue calcium carbonate as needed.  Palpitations and lightheadedness Under cardiac evaluation.  Echocardiogram normal, awaiting heart monitor results. - Await results from the 10-day heart monitor.      No orders of the defined types were placed in this encounter.  The patient has a good understanding of the overall plan. she agrees with it. she will call with any problems that may develop before the next visit here. Total time spent: 30 mins including face to face time and time spent for planning, charting and co-ordination of care   Naomi MARLA Chad, MD 07/07/24

## 2024-07-09 ENCOUNTER — Other Ambulatory Visit

## 2024-07-09 ENCOUNTER — Ambulatory Visit: Admitting: Hematology and Oncology

## 2024-08-11 ENCOUNTER — Encounter

## 2024-08-21 ENCOUNTER — Other Ambulatory Visit (HOSPITAL_BASED_OUTPATIENT_CLINIC_OR_DEPARTMENT_OTHER)

## 2024-08-21 ENCOUNTER — Encounter

## 2024-09-05 ENCOUNTER — Encounter

## 2024-10-08 ENCOUNTER — Ambulatory Visit
Admission: RE | Admit: 2024-10-08 | Discharge: 2024-10-08 | Disposition: A | Discharge status: Home / Self Care | Source: Ambulatory Visit | Attending: Hematology and Oncology | Admitting: Hematology and Oncology

## 2024-10-08 DIAGNOSIS — D0512 Intraductal carcinoma in situ of left breast: Secondary | ICD-10-CM

## 2024-10-14 ENCOUNTER — Ambulatory Visit (HOSPITAL_BASED_OUTPATIENT_CLINIC_OR_DEPARTMENT_OTHER)
Admission: RE | Admit: 2024-10-14 | Discharge: 2024-10-14 | Disposition: A | Discharge status: Home / Self Care | Source: Ambulatory Visit | Attending: Hematology and Oncology | Admitting: Hematology and Oncology

## 2024-10-14 DIAGNOSIS — Z780 Asymptomatic menopausal state: Secondary | ICD-10-CM | POA: Diagnosis present

## 2024-10-15 ENCOUNTER — Ambulatory Visit: Payer: Self-pay

## 2024-10-15 NOTE — Telephone Encounter (Signed)
-----   Message from Morna Kendall, NP sent at 10/14/2024 12:31 PM EST ----- Patient has mild osteopenia, please let her know.  Recommend calcium, vitamin d, and weight bearing exercises.

## 2025-01-05 ENCOUNTER — Ambulatory Visit: Admitting: Hematology and Oncology
